# Patient Record
Sex: Male | Born: 1945 | Race: White | Hispanic: No | Marital: Married | Smoking: Former smoker
Health system: Southern US, Community
[De-identification: ages and names within clinical notes are randomized; demographics above are authoritative.]

## PROBLEM LIST (undated history)

## (undated) DIAGNOSIS — I4819 Other persistent atrial fibrillation: Secondary | ICD-10-CM

## (undated) DIAGNOSIS — G4733 Obstructive sleep apnea (adult) (pediatric): Secondary | ICD-10-CM

## (undated) DIAGNOSIS — M170 Bilateral primary osteoarthritis of knee: Secondary | ICD-10-CM

## (undated) DIAGNOSIS — E669 Obesity, unspecified: Secondary | ICD-10-CM

## (undated) DIAGNOSIS — I700 Atherosclerosis of aorta: Secondary | ICD-10-CM

## (undated) DIAGNOSIS — N400 Enlarged prostate without lower urinary tract symptoms: Secondary | ICD-10-CM

## (undated) DIAGNOSIS — I10 Essential (primary) hypertension: Secondary | ICD-10-CM

## (undated) HISTORY — DX: Benign prostatic hyperplasia without lower urinary tract symptoms: N40.0

## (undated) HISTORY — DX: Bilateral primary osteoarthritis of knee: M17.0

## (undated) HISTORY — DX: Obstructive sleep apnea (adult) (pediatric): G47.33

## (undated) HISTORY — DX: Essential (primary) hypertension: I10

## (undated) HISTORY — DX: Obesity, unspecified: E66.9

## (undated) HISTORY — DX: Atherosclerosis of aorta: I70.0

## (undated) HISTORY — PX: CERVICAL SPINE SURGERY: SHX589

## (undated) HISTORY — DX: Other persistent atrial fibrillation: I48.19

---

## 2013-10-06 DIAGNOSIS — M47817 Spondylosis without myelopathy or radiculopathy, lumbosacral region: Secondary | ICD-10-CM | POA: Insufficient documentation

## 2013-10-06 DIAGNOSIS — M4802 Spinal stenosis, cervical region: Secondary | ICD-10-CM | POA: Insufficient documentation

## 2013-10-06 DIAGNOSIS — N433 Hydrocele, unspecified: Secondary | ICD-10-CM | POA: Insufficient documentation

## 2013-10-06 DIAGNOSIS — Z86010 Personal history of colonic polyps: Secondary | ICD-10-CM | POA: Insufficient documentation

## 2013-10-06 DIAGNOSIS — M545 Low back pain, unspecified: Secondary | ICD-10-CM | POA: Insufficient documentation

## 2013-10-06 DIAGNOSIS — M5137 Other intervertebral disc degeneration, lumbosacral region: Secondary | ICD-10-CM | POA: Insufficient documentation

## 2013-10-06 DIAGNOSIS — J329 Chronic sinusitis, unspecified: Secondary | ICD-10-CM | POA: Insufficient documentation

## 2013-11-11 DIAGNOSIS — I4891 Unspecified atrial fibrillation: Secondary | ICD-10-CM | POA: Insufficient documentation

## 2013-11-28 DIAGNOSIS — R972 Elevated prostate specific antigen [PSA]: Secondary | ICD-10-CM | POA: Insufficient documentation

## 2013-11-28 DIAGNOSIS — K760 Fatty (change of) liver, not elsewhere classified: Secondary | ICD-10-CM | POA: Insufficient documentation

## 2013-11-28 DIAGNOSIS — M1990 Unspecified osteoarthritis, unspecified site: Secondary | ICD-10-CM | POA: Insufficient documentation

## 2013-11-28 DIAGNOSIS — R7303 Prediabetes: Secondary | ICD-10-CM | POA: Insufficient documentation

## 2013-12-07 ENCOUNTER — Inpatient Hospital Stay (HOSPITAL_COMMUNITY)
Admission: AD | Admit: 2013-12-07 | Discharge: 2013-12-10 | DRG: 310 | Disposition: A | Discharge status: Home / Self Care | Payer: Medicare Other | Source: Ambulatory Visit | Attending: Cardiology | Admitting: Cardiology

## 2013-12-07 ENCOUNTER — Encounter: Payer: Self-pay | Admitting: Cardiology

## 2013-12-07 ENCOUNTER — Other Ambulatory Visit: Payer: Self-pay | Admitting: Cardiology

## 2013-12-07 ENCOUNTER — Encounter (HOSPITAL_COMMUNITY): Payer: Self-pay | Admitting: General Practice

## 2013-12-07 ENCOUNTER — Encounter (INDEPENDENT_AMBULATORY_CARE_PROVIDER_SITE_OTHER): Payer: Self-pay

## 2013-12-07 ENCOUNTER — Ambulatory Visit (INDEPENDENT_AMBULATORY_CARE_PROVIDER_SITE_OTHER): Payer: Medicare Other | Admitting: Cardiology

## 2013-12-07 VITALS — BP 114/66 | HR 68 | Ht 68.0 in | Wt 212.0 lb

## 2013-12-07 DIAGNOSIS — R531 Weakness: Secondary | ICD-10-CM | POA: Diagnosis present

## 2013-12-07 DIAGNOSIS — I4891 Unspecified atrial fibrillation: Secondary | ICD-10-CM

## 2013-12-07 DIAGNOSIS — R5381 Other malaise: Secondary | ICD-10-CM

## 2013-12-07 DIAGNOSIS — Z7901 Long term (current) use of anticoagulants: Secondary | ICD-10-CM

## 2013-12-07 DIAGNOSIS — I4510 Unspecified right bundle-branch block: Secondary | ICD-10-CM | POA: Diagnosis present

## 2013-12-07 DIAGNOSIS — I10 Essential (primary) hypertension: Secondary | ICD-10-CM | POA: Diagnosis present

## 2013-12-07 DIAGNOSIS — Z79899 Other long term (current) drug therapy: Secondary | ICD-10-CM

## 2013-12-07 DIAGNOSIS — I440 Atrioventricular block, first degree: Secondary | ICD-10-CM | POA: Diagnosis present

## 2013-12-07 DIAGNOSIS — R5383 Other fatigue: Secondary | ICD-10-CM

## 2013-12-07 LAB — COMPREHENSIVE METABOLIC PANEL
ALT: 53 U/L (ref 0–53)
AST: 29 U/L (ref 0–37)
Albumin: 4 g/dL (ref 3.5–5.2)
Alkaline Phosphatase: 58 U/L (ref 39–117)
BUN: 21 mg/dL (ref 6–23)
CALCIUM: 9.5 mg/dL (ref 8.4–10.5)
CO2: 24 meq/L (ref 19–32)
CREATININE: 0.88 mg/dL (ref 0.50–1.35)
Chloride: 105 mEq/L (ref 96–112)
GFR, EST NON AFRICAN AMERICAN: 86 mL/min — AB (ref >90)
GLUCOSE: 122 mg/dL — AB (ref 70–99)
Potassium: 4.6 mEq/L (ref 3.7–5.3)
Sodium: 142 mEq/L (ref 137–147)
Total Bilirubin: 0.4 mg/dL (ref 0.3–1.2)
Total Protein: 7.1 g/dL (ref 6.0–8.3)

## 2013-12-07 LAB — CBC WITH DIFFERENTIAL/PLATELET
Basophils Absolute: 0 10*3/uL (ref 0.0–0.1)
Basophils Relative: 0 % (ref 0–1)
EOS ABS: 0.1 10*3/uL (ref 0.0–0.7)
EOS PCT: 1 % (ref 0–5)
HEMATOCRIT: 44.5 % (ref 39.0–52.0)
Hemoglobin: 16.2 g/dL (ref 13.0–17.0)
LYMPHS ABS: 1.4 10*3/uL (ref 0.7–4.0)
LYMPHS PCT: 18 % (ref 12–46)
MCH: 34.8 pg — ABNORMAL HIGH (ref 26.0–34.0)
MCHC: 36.4 g/dL — ABNORMAL HIGH (ref 30.0–36.0)
MCV: 95.5 fL (ref 78.0–100.0)
MONO ABS: 0.6 10*3/uL (ref 0.1–1.0)
Monocytes Relative: 8 % (ref 3–12)
Neutro Abs: 5.6 10*3/uL (ref 1.7–7.7)
Neutrophils Relative %: 73 % (ref 43–77)
Platelets: 205 10*3/uL (ref 150–400)
RBC: 4.66 MIL/uL (ref 4.22–5.81)
RDW: 11.8 % (ref 11.5–15.5)
WBC: 7.7 10*3/uL (ref 4.0–10.5)

## 2013-12-07 LAB — TSH: TSH: 1.03 u[IU]/mL (ref 0.350–4.500)

## 2013-12-07 LAB — PROTIME-INR
INR: 1.05 (ref 0.00–1.49)
Prothrombin Time: 13.5 seconds (ref 11.6–15.2)

## 2013-12-07 LAB — TROPONIN I
Troponin I: <0.3 ng/mL (ref <0.30)
Troponin I: <0.3 ng/mL (ref <0.30)

## 2013-12-07 LAB — APTT: aPTT: 32 seconds (ref 24–37)

## 2013-12-07 LAB — MAGNESIUM: MAGNESIUM: 2.1 mg/dL (ref 1.5–2.5)

## 2013-12-07 LAB — PRO B NATRIURETIC PEPTIDE: PRO B NATRI PEPTIDE: 536 pg/mL — AB (ref 0–125)

## 2013-12-07 MED ORDER — SODIUM CHLORIDE 0.9 % IV SOLN
INTRAVENOUS | Status: DC | PRN
  Administered 2013-12-07: 500 mL via INTRAVENOUS

## 2013-12-07 MED ORDER — RIVAROXABAN 20 MG PO TABS
20.0000 mg | ORAL_TABLET | Freq: Every day | ORAL | Status: DC
  Administered 2013-12-07 – 2013-12-09 (×3): 20 mg via ORAL
  Filled 2013-12-07 (×4): qty 1

## 2013-12-07 MED ORDER — SODIUM CHLORIDE 0.9 % IJ SOLN: 3.0000 mL | INTRAMUSCULAR | Status: DC | PRN

## 2013-12-07 MED ORDER — SODIUM CHLORIDE 0.9 % IJ SOLN
3.0000 mL | Freq: Two times a day (BID) | INTRAMUSCULAR | Status: DC
  Administered 2013-12-07 – 2013-12-09 (×4): 3 mL via INTRAVENOUS

## 2013-12-07 MED ORDER — ACETAMINOPHEN 325 MG PO TABS: 650.0000 mg | ORAL_TABLET | ORAL | Status: DC | PRN

## 2013-12-07 MED ORDER — SODIUM CHLORIDE 0.9 % IV SOLN
250.0000 mL | INTRAVENOUS | Status: DC | PRN
  Administered 2013-12-09: 13:00:00 via INTRAVENOUS

## 2013-12-07 MED ORDER — DILTIAZEM LOAD VIA INFUSION
10.0000 mg | Freq: Once | INTRAVENOUS | Status: AC
  Administered 2013-12-07: 10 mg via INTRAVENOUS
  Filled 2013-12-07: qty 10

## 2013-12-07 MED ORDER — DILTIAZEM HCL 100 MG IV SOLR
5.0000 mg/h | INTRAVENOUS | Status: DC
  Administered 2013-12-07: 5 mg/h via INTRAVENOUS
  Administered 2013-12-08: 15 mg/h via INTRAVENOUS
  Administered 2013-12-08: 10 mg/h via INTRAVENOUS
  Administered 2013-12-09 (×3): 15 mg/h via INTRAVENOUS
  Filled 2013-12-07 (×3): qty 100

## 2013-12-07 MED ORDER — ONDANSETRON HCL 4 MG/2ML IJ SOLN: 4.0000 mg | Freq: Four times a day (QID) | INTRAMUSCULAR | Status: DC | PRN

## 2013-12-07 NOTE — H&P (Signed)
[name], [place],   [address] Phone: [phone] Fax:  [phone]  Date:  12/07/2013   ID:  Corey Hood, DOB 1945/12/01, MRN [id number]  PCP:  [name]., MD  Cardiologist:  [name], MD     History of Present Illness: Corey Hood is a 68 y.o. male with no prior cardiac history, presented to Dr. Valora Piccolo with complaints of fatigue and decreased exercise tolerance.  He finally took his BP and the cuff read out with HR 150bpm.  He denied any chest pain or pressure but has noted increased DOE.  He denies any PND or orthopnea.  He has noticed some mild LE edema.   An EKG showed atrial fibrillation with RVR and was started on metoprolol.  He now presents for cardiac evaluation.  He stopped taking the metoprolol due to making him nauseated and he was started on Cardizem CD.  After starting the Cardizem he feels better although he still does not have much energy.   Wt Readings from Last 3 Encounters:  12/07/13 212 lb (96.163 kg)     Past Medical History  Diagnosis Date  . Arthritis of both knees   . Hypertension   . BPH (benign prostatic hyperplasia)   . Atrial fibrillation with RVR     Current Outpatient Prescriptions  Medication Sig Dispense Refill  . amLODipine-benazepril (LOTREL) 5-10 MG per capsule Take 1 capsule by mouth daily.      Marland Kitchen aspirin 325 MG tablet Take 325 mg by mouth daily.      . cyclobenzaprine (FLEXERIL) 10 MG tablet Take 10 mg by mouth 3 (three) times daily as needed for muscle spasms.      Marland Kitchen diltiazem (CARDIZEM) 120 MG tablet Take 120 mg by mouth 4 (four) times daily.      . meloxicam (MOBIC) 15 MG tablet Take 15 mg by mouth daily.       No current facility-administered medications for this visit.    Allergies:    Allergies  Allergen Reactions  . Penicillins     Social History:  The patient  reports that he has quit smoking. He does not have any smokeless tobacco history on file. He reports that he drinks about 16.8 ounces of  alcohol per week. He reports that he does not use illicit drugs.   Family History:  The patient's family history includes Cancer in his father and mother.   ROS:  Please see the history of present illness.      All other systems reviewed and negative.   PHYSICAL EXAM: VS:  BP 114/66  Pulse 68  Ht 5\' 8"  (1.727 m)  Wt 212 lb (96.163 kg)  BMI 32.24 kg/m2 Well nourished, well developed, in no acute distress HEENT: normal Neck: no JVD Cardiac:  normal S1, S2; irregularly irregular; no murmur Lungs:  clear to auscultation bilaterally, no wheezing, rhonchi or rales Abd: soft, nontender, no hepatomegaly Ext: no edema Skin: warm and dry Neuro:  CNs 2-12 intact, no focal abnormalities noted  EKG:  Atrial fibrillation with RVR with rightward axis     ASSESSMENT AND PLAN:  1. Atrial fibrillation with RVR of unknown duration but probably for several months - HR currently is 150bpm on Cardizem.  His BP is borderline low and I am leary about increasing his Cardizem PO any further.   - admit to stepdown tele bed - IV Cardizem gtt and titrate to get HR <100bpm as BP tolerates - check CMET/PT/INR/TSH/CBC -  2D echo to assess LVF once HR slowed down - PA and lat chest xray - BNP - start Xarelto 20mg  daily per pharmacy - further workup pending results of echo 2.  Weakness secondary to #1 3.  HTN controlled  Signed, [name], MD 12/07/2013 2:32 PM

## 2013-12-07 NOTE — Progress Notes (Signed)
[place], [place], [place]  [address] Phone: [phone] Fax:  [phone]  Date:  12/07/2013   ID:  Corey Hood, DOB 1945-10-15, MRN [id number]  PCP:  [name]., MD  Cardiologist:  [name], MD     History of Present Illness: Corey Hood is a 68 y.o. male with no prior cardiac history, presented to Dr. Valora Piccolo with complaints of fatigue and decreased exercise tolerance.  He finally took his BP and the cuff read out with HR 150bpm.  He denied any chest pain or pressure but has noted increased DOE.  He denies any PND or orthopnea.  He has noticed some mild LE edema.   An EKG showed atrial fibrillation with RVR and was started on metoprolol.  He now presents for cardiac evaluation.  He stopped taking the metoprolol due to making him nauseated and he was started on Cardizem CD.  After starting the Cardizem he feels better although he still does not have much energy.   Wt Readings from Last 3 Encounters:  12/07/13 212 lb (96.163 kg)     Past Medical History  Diagnosis Date  . Arthritis of both knees   . Hypertension   . BPH (benign prostatic hyperplasia)   . Atrial fibrillation with RVR     Current Outpatient Prescriptions  Medication Sig Dispense Refill  . amLODipine-benazepril (LOTREL) 5-10 MG per capsule Take 1 capsule by mouth daily.      Marland Kitchen aspirin 325 MG tablet Take 325 mg by mouth daily.      . cyclobenzaprine (FLEXERIL) 10 MG tablet Take 10 mg by mouth 3 (three) times daily as needed for muscle spasms.      Marland Kitchen diltiazem (CARDIZEM) 120 MG tablet Take 120 mg by mouth 4 (four) times daily.      . meloxicam (MOBIC) 15 MG tablet Take 15 mg by mouth daily.       No current facility-administered medications for this visit.    Allergies:    Allergies  Allergen Reactions  . Penicillins     Social History:  The patient  reports that he has quit smoking. He does not have any smokeless tobacco history on file. He reports that he drinks about 16.8 ounces of alcohol  per week. He reports that he does not use illicit drugs.   Family History:  The patient's family history includes Cancer in his father and mother.   ROS:  Please see the history of present illness.      All other systems reviewed and negative.   PHYSICAL EXAM: VS:  BP 114/66  Pulse 68  Ht 5\' 8"  (1.727 m)  Wt 212 lb (96.163 kg)  BMI 32.24 kg/m2 Well nourished, well developed, in no acute distress HEENT: normal Neck: no JVD Cardiac:  normal S1, S2; irregularly irregular; no murmur Lungs:  clear to auscultation bilaterally, no wheezing, rhonchi or rales Abd: soft, nontender, no hepatomegaly Ext: no edema Skin: warm and dry Neuro:  CNs 2-12 intact, no focal abnormalities noted  EKG:  Atrial fibrillation with RVR with rightward axis     ASSESSMENT AND PLAN:  1. Atrial fibrillation with RVR of unknown duration but probably for several months - HR currently is 150bpm on Cardizem.  His BP is borderline low and I am leary about increasing his Cardizem PO any further.   - admit to stepdown tele bed - IV Cardizem gtt and titrate to get HR <100bpm as BP tolerates - check CMET/PT/INR/TSH/CBC - 2D echo  to assess LVF once HR slowed down - PA and lat chest xray - BNP - start Xarelto 20mg  daily per pharmacy - further workup pending results of echo 2.  Weakness secondary to #1 3.  HTN controlled  Signed, [name], MD 12/07/2013 2:32 PM

## 2013-12-08 ENCOUNTER — Inpatient Hospital Stay (HOSPITAL_COMMUNITY): Payer: Medicare Other

## 2013-12-08 DIAGNOSIS — I059 Rheumatic mitral valve disease, unspecified: Secondary | ICD-10-CM

## 2013-12-08 DIAGNOSIS — I4891 Unspecified atrial fibrillation: Principal | ICD-10-CM

## 2013-12-08 DIAGNOSIS — I10 Essential (primary) hypertension: Secondary | ICD-10-CM

## 2013-12-08 LAB — BASIC METABOLIC PANEL
BUN: 19 mg/dL (ref 6–23)
CALCIUM: 8.9 mg/dL (ref 8.4–10.5)
CO2: 21 mEq/L (ref 19–32)
Chloride: 106 mEq/L (ref 96–112)
Creatinine, Ser: 0.81 mg/dL (ref 0.50–1.35)
GFR calc Af Amer: >90 mL/min (ref >90)
GFR, EST NON AFRICAN AMERICAN: 89 mL/min — AB (ref >90)
Glucose, Bld: 111 mg/dL — ABNORMAL HIGH (ref 70–99)
Potassium: 4.6 mEq/L (ref 3.7–5.3)
SODIUM: 139 meq/L (ref 137–147)

## 2013-12-08 LAB — CBC
HCT: 42.1 % (ref 39.0–52.0)
Hemoglobin: 15 g/dL (ref 13.0–17.0)
MCH: 34.2 pg — ABNORMAL HIGH (ref 26.0–34.0)
MCHC: 35.6 g/dL (ref 30.0–36.0)
MCV: 95.9 fL (ref 78.0–100.0)
PLATELETS: 187 10*3/uL (ref 150–400)
RBC: 4.39 MIL/uL (ref 4.22–5.81)
RDW: 11.8 % (ref 11.5–15.5)
WBC: 5.9 10*3/uL (ref 4.0–10.5)

## 2013-12-08 LAB — TROPONIN I

## 2013-12-08 LAB — MRSA PCR SCREENING: MRSA by PCR: NEGATIVE

## 2013-12-08 MED ORDER — ALPRAZOLAM 0.5 MG PO TABS: 0.5000 mg | ORAL_TABLET | Freq: Two times a day (BID) | ORAL | Status: DC | PRN

## 2013-12-08 MED ORDER — FLECAINIDE ACETATE 100 MG PO TABS
100.0000 mg | ORAL_TABLET | Freq: Two times a day (BID) | ORAL | Status: DC
  Administered 2013-12-08 – 2013-12-10 (×5): 100 mg via ORAL
  Filled 2013-12-08 (×6): qty 1

## 2013-12-08 NOTE — Progress Notes (Signed)
TELEMETRY: Reviewed telemetry pt in atrial fibrillation with rate 130 bpm.: Filed Vitals:   12/08/13 0805 12/08/13 0945 12/08/13 1156 12/08/13 1200  BP: 135/85 118/100 118/66 118/66  Pulse:      Temp: 98 F (36.7 C)  98.3 F (36.8 C)   TempSrc: Oral  Oral   Resp: 16     Height:      Weight:      SpO2: 98%  98%     Intake/Output Summary (Last 24 hours) at 12/08/13 1311 Last data filed at 12/08/13 1300  Gross per 24 hour  Intake 1718.75 ml  Output    950 ml  Net 768.75 ml   Filed Weights   12/07/13 1736  Weight: 206 lb 2.1 oz (93.5 kg)    Subjective Feels weak. No SOB or chest pain.  . flecainide  100 mg Oral Q12H  . rivaroxaban  20 mg Oral Q supper  . sodium chloride  3 mL Intravenous Q12H   On IV diltiazem 10 mg /hr  LABS: Basic Metabolic Panel:  Recent Labs  12/07/13 1800 12/08/13 0705  NA 142 139  K 4.6 4.6  CL 105 106  CO2 24 21  GLUCOSE 122* 111*  BUN 21 19  CREATININE 0.88 0.81  CALCIUM 9.5 8.9  MG 2.1  --    Liver Function Tests:  Recent Labs  12/07/13 1800  AST 29  ALT 53  ALKPHOS 58  BILITOT 0.4  PROT 7.1  ALBUMIN 4.0   No results found for this basename: LIPASE, AMYLASE,  in the last 72 hours CBC:  Recent Labs  12/07/13 1800 12/08/13 0705  WBC 7.7 5.9  NEUTROABS 5.6  --   HGB 16.2 15.0  HCT 44.5 42.1  MCV 95.5 95.9  PLT 205 187   Cardiac Enzymes:  Recent Labs  12/07/13 1740 12/07/13 2314 12/08/13 0705  TROPONINI <0.30 <0.30 <0.30   BNP:  Recent Labs  12/07/13 1740  PROBNP 536.0*   Thyroid Function Tests:  Recent Labs  12/07/13 1800  TSH 1.030     Radiology/Studies:  X-ray Chest Pa And Lateral  12/08/2013   CLINICAL DATA:  Shortness of breath.  Atrial fibrillation.  EXAM: CHEST  2 VIEW  COMPARISON:  None.  FINDINGS: Heart size and pulmonary vascularity are normal. Lungs are clear except for a calcified granuloma at the right lung base laterally. No acute osseous abnormality. No effusions.   IMPRESSION: No acute abnormalities.   Electronically Signed   By: Rozetta Nunnery M.D.   On: 12/08/2013 08:09    PHYSICAL EXAM General: Well developed, well nourished, in no acute distress. Head: Normocephalic, atraumatic, sclera non-icteric, oropharynx is clear Neck: Negative for carotid bruits. JVD not elevated. No adenopathy Lungs: Clear bilaterally to auscultation without wheezes, rales, or rhonchi. Breathing is unlabored. Heart: IRRR S1 S2 without murmurs, rubs, or gallops.  Abdomen: Soft, non-tender, non-distended with normoactive bowel sounds. No hepatomegaly. No rebound/guarding. No obvious abdominal masses. Msk:  Strength and tone appears normal for age. Extremities: No clubbing, cyanosis or edema.  Distal pedal pulses are 2+ and equal bilaterally. Neuro: Alert and oriented X 3. Moves all extremities spontaneously. Psych:  Responds to questions appropriately with a normal affect.  ASSESSMENT AND PLAN: 1. Atrial fibrillation with RVR. Rate high despite IV diltiazem. He did not tolerate metoprolol as outpatient. Will titrate diltiazem up. On Xarelto for anticoagulation. Echo reviewed and shows normal LV function. Mild MR/TR. biatrial enlargement. He is clearly symptomatic. Will initiate antiarrhythmic therapy  with Flecainide 100 mg bid. Plan TEE cardioversion in am.  2. HTN controlled.  Present on Admission:  . Atrial fibrillation with RVR  Signed, Peter M Martin[place], [place] 12/08/2013 1:11 PM

## 2013-12-08 NOTE — Care Management Note (Addendum)
    Page 1 of 1   12/08/2013     10:27:25 AM CARE MANAGEMENT NOTE 12/08/2013  Patient:  Corey Hood, Corey Hood   Account Number:  [id number]  Date Initiated:  12/08/2013  Documentation initiated by:  Elissa Hefty  Subjective/Objective Assessment:   adm w at fib w rvr     Action/Plan:   lives w wife, pcp dr Mamie Levers   Anticipated DC Date:     Anticipated DC Plan:        [place]  CM consult  Medication Assistance      Choice offered to / List presented to:             Status of service:   Medicare Important Message given?   (If response is "NO", the following Medicare IM given date fields will be blank) Date Medicare IM given:   Date Additional Medicare IM given:    Discharge Disposition:    Per UR Regulation:  Reviewed for med. necessity/level of care/duration of stay  If discussed at [place] of Stay Meetings, dates discussed:    Comments:  5/28 1026a debbie Adrean Heitz rn,bsn gave pt 30day free xarelto card. [facility]

## 2013-12-08 NOTE — Progress Notes (Signed)
  Echocardiogram 2D Echocardiogram has been performed.  Basilia Jumbo 12/08/2013, 10:51 AM

## 2013-12-09 ENCOUNTER — Encounter (HOSPITAL_COMMUNITY): Payer: Self-pay | Admitting: Gastroenterology

## 2013-12-09 ENCOUNTER — Encounter (HOSPITAL_COMMUNITY)
Admission: AD | Disposition: A | Discharge status: Home / Self Care | Payer: Self-pay | Source: Ambulatory Visit | Attending: Cardiology

## 2013-12-09 ENCOUNTER — Inpatient Hospital Stay (HOSPITAL_COMMUNITY): Payer: Medicare Other | Admitting: Anesthesiology

## 2013-12-09 ENCOUNTER — Encounter (HOSPITAL_COMMUNITY): Payer: Medicare Other | Admitting: Anesthesiology

## 2013-12-09 DIAGNOSIS — I4891 Unspecified atrial fibrillation: Secondary | ICD-10-CM

## 2013-12-09 HISTORY — PX: TEE WITHOUT CARDIOVERSION: SHX5443

## 2013-12-09 HISTORY — PX: CARDIOVERSION: SHX1299

## 2013-12-09 SURGERY — ECHOCARDIOGRAM, TRANSESOPHAGEAL
Anesthesia: Monitor Anesthesia Care

## 2013-12-09 MED ORDER — SODIUM CHLORIDE 0.9 % IV SOLN
INTRAVENOUS | Status: DC
  Administered 2013-12-09: 500 mL via INTRAVENOUS

## 2013-12-09 MED ORDER — DIPHENHYDRAMINE HCL 50 MG/ML IJ SOLN
INTRAMUSCULAR | Status: AC
  Filled 2013-12-09: qty 1

## 2013-12-09 MED ORDER — MIDAZOLAM HCL 10 MG/2ML IJ SOLN
INTRAMUSCULAR | Status: DC | PRN
  Administered 2013-12-09 (×3): 2 mg via INTRAVENOUS

## 2013-12-09 MED ORDER — MIDAZOLAM HCL 5 MG/ML IJ SOLN
INTRAMUSCULAR | Status: AC
  Filled 2013-12-09: qty 2

## 2013-12-09 MED ORDER — BUTAMBEN-TETRACAINE-BENZOCAINE 2-2-14 % EX AERO
INHALATION_SPRAY | CUTANEOUS | Status: DC | PRN
  Administered 2013-12-09: 2 via TOPICAL

## 2013-12-09 MED ORDER — FENTANYL CITRATE 0.05 MG/ML IJ SOLN
INTRAMUSCULAR | Status: DC | PRN
  Administered 2013-12-09 (×3): 25 ug via INTRAVENOUS

## 2013-12-09 MED ORDER — LIDOCAINE HCL (CARDIAC) 20 MG/ML IV SOLN
INTRAVENOUS | Status: DC | PRN
  Administered 2013-12-09: 30 mg via INTRAVENOUS

## 2013-12-09 MED ORDER — FENTANYL CITRATE 0.05 MG/ML IJ SOLN
INTRAMUSCULAR | Status: AC
  Filled 2013-12-09: qty 2

## 2013-12-09 MED ORDER — PROPOFOL 10 MG/ML IV BOLUS
INTRAVENOUS | Status: DC | PRN
  Administered 2013-12-09: 70 mg via INTRAVENOUS

## 2013-12-09 NOTE — Discharge Instructions (Addendum)
Information on my medicine - XARELTO (Rivaroxaban)  Why was Xarelto prescribed for you? Xarelto was prescribed for you to reduce the risk of a blood clot forming that can cause a stroke if you have a medical condition called atrial fibrillation (a type of irregular heartbeat).  What do you need to know about xarelto ? Take your Xarelto ONCE DAILY at the same time every day with your evening meal. If you have difficulty swallowing the tablet whole, you may crush it and mix in applesauce just prior to taking your dose.  Take Xarelto exactly as prescribed by your doctor and DO NOT stop taking Xarelto without talking to the doctor who prescribed the medication.  Stopping without other stroke prevention medication to take the place of Xarelto may increase your risk of developing a clot that causes a stroke.  Refill your prescription before you run out.  After discharge, you should have regular check-up appointments with your healthcare provider that is prescribing your Xarelto.  In the future your dose may need to be changed if your kidney function or weight changes by a significant amount.  What do you do if you miss a dose? If you are taking Xarelto ONCE DAILY and you miss a dose, take it as soon as you remember on the same day then continue your regularly scheduled once daily regimen the next day. Do not take two doses of Xarelto at the same time or on the same day.   Important Safety Information A possible side effect of Xarelto is bleeding. You should call your healthcare provider right away if you experience any of the following:   Bleeding from an injury or your nose that does not stop.   Unusual colored urine (red or dark brown) or unusual colored stools (red or black).   Unusual bruising for unknown reasons.   A serious fall or if you hit your head (even if there is no bleeding).  Some medicines may interact with Xarelto and might increase your risk of bleeding while on  Xarelto. To help avoid this, consult your healthcare provider or pharmacist prior to using any new prescription or non-prescription medications, including herbals, vitamins, non-steroidal anti-inflammatory drugs (NSAIDs) and supplements.  This website has more information on Xarelto: [email or web address]   Atrial Fibrillation Atrial fibrillation is a type of irregular heart rhythm (arrhythmia). During atrial fibrillation, the upper chambers of the heart (atria) quiver continuously in a chaotic pattern. This causes an irregular and often rapid heart rate.  Atrial fibrillation is the result of the heart becoming overloaded with disorganized signals that tell it to beat. These signals are normally released one at a time by a part of the right atrium called the sinoatrial node. They then travel from the atria to the lower chambers of the heart (ventricles), causing the atria and ventricles to contract and pump blood as they pass. In atrial fibrillation, parts of the atria outside of the sinoatrial node also release these signals. This results in two problems. First, the atria receive so many signals that they do not have time to fully contract. Second, the ventricles, which can only receive one signal at a time, beat irregularly and out of rhythm with the atria.  There are three types of atrial fibrillation:   Paroxysmal Paroxysmal atrial fibrillation starts suddenly and stops on its own within a week.   Persistent Persistent atrial fibrillation lasts for more than a week. It may stop on its own or with treatment.   Permanent Permanent atrial  fibrillation does not go away. Episodes of atrial fibrillation may lead to permanent atrial fibrillation.  Atrial fibrillation can prevent your heart from pumping blood normally. It increases your risk of stroke and can lead to heart failure.  CAUSES   Heart conditions, including a heart attack, heart failure, coronary artery disease, and heart valve conditions.    Inflammation of the sac that surrounds the heart (pericarditis).   Blockage of an artery in the lungs (pulmonary embolism).   Pneumonia or other infections.   Chronic lung disease.   Thyroid problems, especially if the thyroid is overactive (hyperthyroidism).   Caffeine, excessive alcohol use, and use of some illegal drugs.   Use of some medications, including certain decongestants and diet pills.   Heart surgery.   Birth defects.  Sometimes, no cause can be found. When this happens, the atrial fibrillation is called lone atrial fibrillation. The risk of complications from atrial fibrillation increases if you have lone atrial fibrillation and you are age 53 years or older. RISK FACTORS  Heart failure.  Coronary artery disease  Diabetes mellitus.   High blood pressure (hypertension).   Obesity.   Other arrhythmias.   Increased age. SYMPTOMS   A feeling that your heart is beating rapidly or irregularly.   A feeling of discomfort or pain in your chest.   Shortness of breath.   Sudden lightheadedness or weakness.   Getting tired easily when exercising.   Urinating more often than normal (mainly when atrial fibrillation first begins).  In paroxysmal atrial fibrillation, symptoms may start and suddenly stop. DIAGNOSIS  Your caregiver may be able to detect atrial fibrillation when taking your pulse. Usually, testing is needed to diagnosis atrial fibrillation. Tests may include:   Electrocardiography. During this test, the electrical impulses of your heart are recorded while you are lying down.   Echocardiography. During echocardiography, sound waves are used to evaluate how blood flows through your heart.   Stress test. There is more than one type of stress test. If a stress test is needed, ask your caregiver about which type is best for you.   Chest X-ray exam.   Blood tests.   Computed tomography (CT).  TREATMENT   Treating any  underlying conditions. For example, if you have an overactive thyroid, treating the condition may correct atrial fibrillation.   Medication. Medications may be given to control a rapid heart rate or to prevent blood clots, heart failure, or a stroke.   Procedure to correct the rhythm of the heart:  Electrical cardioversion. During electrical cardioversion, a controlled, low-energy shock is delivered to the heart through your skin. If you have chest pain, very low pressure blood pressure, or sudden heart failure, this procedure may need to be done as an emergency.  Catheter ablation. During this procedure, heart tissues that send the signals that cause atrial fibrillation are destroyed.  Maze or minimaze procedure. During this surgery, thin lines of heart tissue that carry the abnormal signals are destroyed. The maze procedure is an open-heart surgery. The minimaze procedure is a minimally invasive surgery. This means that small cuts are made to access the heart instead of a large opening.  Pulmonary venous isolation. During this surgery, tissue around the veins that carry blood from the lungs (pulmonary veins) is destroyed. This tissue is thought to carry the abnormal signals. HOME CARE INSTRUCTIONS   Take medications as directed by your caregiver.  Only take medications that your caregiver approves. Some medications can make atrial fibrillation worse or  recur.  If blood thinners were prescribed by your caregiver, take them exactly as directed. Too much can cause bleeding. Too little and you will not have the needed protection against stroke and other problems.  Perform blood tests at home if directed by your caregiver.  Perform blood tests exactly as directed.   Quit smoking if you smoke.   Do not drink alcohol.   Do not drink caffeinated beverages such as coffee, soda, and some teas. You may drink decaffeinated coffee, soda, or tea.   Maintain a healthy weight. Do not use diet  pills unless your caregiver approves. They may make heart problems worse.   Follow diet instructions as directed by your caregiver.   Exercise regularly as directed by your caregiver.   Keep all follow-up appointments. PREVENTION  The following substances can cause atrial fibrillation to recur:   Caffeinated beverages.   Alcohol.   Certain medications, especially those used for breathing problems.   Certain herbs and herbal medications, such as those containing ephedra or ginseng.  Illegal drugs such as cocaine and amphetamines. Sometimes medications are given to prevent atrial fibrillation from recurring. Proper treatment of any underlying condition is also important in helping prevent recurrence.  SEEK MEDICAL CARE IF:  You notice a change in the rate, rhythm, or strength of your heartbeat.   You suddenly begin urinating more frequently.   You tire more easily when exerting yourself or exercising.  SEEK IMMEDIATE MEDICAL CARE IF:   You develop chest pain, abdominal pain, sweating, or weakness.  You feel sick to your stomach (nauseous).  You develop shortness of breath.  You suddenly develop swollen feet and ankles.  You feel dizzy.  You face or limbs feel numb or weak.  There is a change in your vision or speech. MAKE SURE YOU:   Understand these instructions.  Will watch your condition.  Will get help right away if you are not doing well or get worse. Document Released: 06/30/2005 Document Revised: 10/25/2012 Document Reviewed: 08/10/2012 [facility] Patient Information 2014 [place].   Heart Healthy Diet  Call if your heart rate increases.  You will need stress test, please ask about scheduling on your next visit.   We stopped your lotrel and added benazepril alone for BP control.

## 2013-12-09 NOTE — Procedures (Signed)
Electrical Cardioversion Procedure Note Corey Hood [id number] 20-Dec-1945  Procedure: Electrical Cardioversion Indications:  Atrial Fibrillation.  TEE showed no thrombus, patient is on Xarelto.   Procedure Details Consent: Risks of procedure as well as the alternatives and risks of each were explained to the (patient/caregiver).  Consent for procedure obtained. Time Out: Verified patient identification, verified procedure, site/side was marked, verified correct patient position, special equipment/implants available, medications/allergies/relevent history reviewed, required imaging and test results available.  Performed  Patient placed on cardiac monitor, pulse oximetry, supplemental oxygen as necessary.  Sedation given: Propofol per anesthesiology.  Pacer pads placed anterior and posterior chest.  Cardioverted 1 time(s).  Cardioverted at [place].  Evaluation Findings: Post procedure EKG shows: NSR Complications: None Patient did tolerate procedure well.   Larey Dresser 12/09/2013, 1:22 PM

## 2013-12-09 NOTE — Anesthesia Postprocedure Evaluation (Signed)
  Anesthesia Post-op Note  Patient: Corey Hood  Procedure(s) Performed: Procedure(s): TRANSESOPHAGEAL ECHOCARDIOGRAM (TEE) (N/A) CARDIOVERSION (N/A)  Patient Location: Endoscopy Unit  Anesthesia Type:General  Level of Consciousness: awake, alert  and oriented  Airway and Oxygen Therapy: Patient Spontanous Breathing and Patient connected to nasal cannula oxygen  Post-op Pain: none  Post-op Assessment: Post-op Vital signs reviewed, Patient's Cardiovascular Status Stable, Respiratory Function Stable and Patent Airway  Post-op Vital Signs: Reviewed and stable  Last Vitals:  Filed Vitals:   12/09/13 1315  BP: 152/98  Pulse: 104  Temp:   Resp: 16    Complications: No apparent anesthesia complications

## 2013-12-09 NOTE — Progress Notes (Signed)
  Echocardiogram Echocardiogram Transesophageal has been performed.  Sherriann Szuch Orlean Patten 12/09/2013, 2:40 PM

## 2013-12-09 NOTE — Interval H&P Note (Signed)
History and Physical Interval Note:  12/09/2013 12:53 PM  Corey Hood  has presented today for surgery, with the diagnosis of AFIB  The various methods of treatment have been discussed with the patient and family. After consideration of risks, benefits and other options for treatment, the patient has consented to  Procedure(s): TRANSESOPHAGEAL ECHOCARDIOGRAM (TEE) (N/A) CARDIOVERSION (N/A) as a surgical intervention .  The patient's history has been reviewed, patient examined, no change in status, stable for surgery.  I have reviewed the patient's chart and labs.  Questions were answered to the patient's satisfaction.     Corey Hood [name]

## 2013-12-09 NOTE — CV Procedure (Signed)
Procedure: TEE  Indication: Atrial fibrillation  Sedation: Versed 6 mg IV, Fentanyl 75 mcg IV  Findings:  Please see echo section for full report.  Normal LV size and systolic function, EF 09-62%.  Trivial MR. Normal RV size and systolic function.  No PFO or ASD.  Mild LA enlargement.  No LAA thrombus.    May proceed to cardioversion.  Corey Hood 12/09/2013 1:20 PM

## 2013-12-09 NOTE — H&P (View-Only) (Signed)
TELEMETRY: Reviewed telemetry pt in atrial fibrillation with rate 110 bpm.: Filed Vitals:   12/08/13 1944 12/08/13 2347 12/09/13 0325 12/09/13 0807  BP: 147/66 130/75 124/96 127/86  Pulse:   90   Temp: 98.1 F (36.7 C) 98 F (36.7 C) 97.8 F (36.6 C) 98.6 F (37 C)  TempSrc: Oral Oral Oral Oral  Resp:    18  Height:      Weight:      SpO2:   98% 96%    Intake/Output Summary (Last 24 hours) at 12/09/13 0955 Last data filed at 12/09/13 0900  Gross per 24 hour  Intake    980 ml  Output    700 ml  Net    280 ml   Filed Weights   12/07/13 1736  Weight: 206 lb 2.1 oz (93.5 kg)    Subjective Feels weak. No SOB or chest pain.  . flecainide  100 mg Oral Q12H  . rivaroxaban  20 mg Oral Q supper  . sodium chloride  3 mL Intravenous Q12H   On IV diltiazem 15 mg /hr  LABS: Basic Metabolic Panel:  Recent Labs  12/07/13 1800 12/08/13 0705  NA 142 139  K 4.6 4.6  CL 105 106  CO2 24 21  GLUCOSE 122* 111*  BUN 21 19  CREATININE 0.88 0.81  CALCIUM 9.5 8.9  MG 2.1  --    Liver Function Tests:  Recent Labs  12/07/13 1800  AST 29  ALT 53  ALKPHOS 58  BILITOT 0.4  PROT 7.1  ALBUMIN 4.0   No results found for this basename: LIPASE, AMYLASE,  in the last 72 hours CBC:  Recent Labs  12/07/13 1800 12/08/13 0705  WBC 7.7 5.9  NEUTROABS 5.6  --   HGB 16.2 15.0  HCT 44.5 42.1  MCV 95.5 95.9  PLT 205 187   Cardiac Enzymes:  Recent Labs  12/07/13 1740 12/07/13 2314 12/08/13 0705  TROPONINI <0.30 <0.30 <0.30   BNP:  Recent Labs  12/07/13 1740  PROBNP 536.0*   Thyroid Function Tests:  Recent Labs  12/07/13 1800  TSH 1.030     Radiology/Studies:  X-ray Chest Pa And Lateral  12/08/2013   CLINICAL DATA:  Shortness of breath.  Atrial fibrillation.  EXAM: CHEST  2 VIEW  COMPARISON:  None.  FINDINGS: Heart size and pulmonary vascularity are normal. Lungs are clear except for a calcified granuloma at the right lung base laterally. No acute  osseous abnormality. No effusions.  IMPRESSION: No acute abnormalities.   Electronically Signed   By: Rozetta Nunnery M.D.   On: 12/08/2013 08:09   Study Conclusions/Echo  - Left ventricle: The cavity size was normal. Systolic function was normal. The estimated ejection fraction was in the range of 55% to 60%. Wall motion was normal; there were no regional wall motion abnormalities. - Mitral valve: There was mild regurgitation directed eccentrically. - Left atrium: The atrium was mildly to moderately dilated. - Right atrium: The atrium was mildly dilated. - Tricuspid valve: There was trivial regurgitation. - Pulmonary arteries: PA peak pressure: 32 mm Hg (S).   PHYSICAL EXAM General: Well developed, well nourished, in no acute distress. Head: Normocephalic, atraumatic, sclera non-icteric, oropharynx is clear Neck: Negative for carotid bruits. JVD not elevated. No adenopathy Lungs: Clear bilaterally to auscultation without wheezes, rales, or rhonchi. Breathing is unlabored. Heart: IRRR S1 S2 without murmurs, rubs, or gallops.  Abdomen: Soft, non-tender, non-distended with normoactive bowel sounds. No hepatomegaly. No rebound/guarding. No  obvious abdominal masses. Msk:  Strength and tone appears normal for age. Extremities: No clubbing, cyanosis or edema.  Distal pedal pulses are 2+ and equal bilaterally. Neuro: Alert and oriented X 3. Moves all extremities spontaneously. Psych:  Responds to questions appropriately with a normal affect.  ASSESSMENT AND PLAN: 1. Atrial fibrillation with RVR. Rate high despite IV diltiazem. He did not tolerate metoprolol as outpatient.  On Xarelto for anticoagulation. Echo reviewed and shows normal LV function. Mild MR/TR. biatrial enlargement. He is clearly symptomatic. On antiarrhythmic therapy with Flecainide 100 mg bid. Plan TEE cardioversion today.  2. HTN controlled.  Present on Admission:  . Atrial fibrillation with RVR  Signed, Peter M [place],  [place] 12/09/2013 9:55 AM

## 2013-12-09 NOTE — Transfer of Care (Signed)
Immediate Anesthesia Transfer of Care Note  Patient: Corey Hood  Procedure(s) Performed: Procedure(s): TRANSESOPHAGEAL ECHOCARDIOGRAM (TEE) (N/A) CARDIOVERSION (N/A)  Patient Location: Endoscopy Unit  Anesthesia Type:General  Level of Consciousness: awake, alert  and oriented  Airway & Oxygen Therapy: Patient Spontanous Breathing and Patient connected to nasal cannula oxygen  Post-op Assessment: Report given to PACU RN and Post -op Vital signs reviewed and stable  Post vital signs: Reviewed and stable  Complications: No apparent anesthesia complications

## 2013-12-09 NOTE — Anesthesia Postprocedure Evaluation (Signed)
  Anesthesia Post-op Note  Patient: Corey Hood  Procedure(s) Performed: Procedure(s): TRANSESOPHAGEAL ECHOCARDIOGRAM (TEE) (N/A) CARDIOVERSION (N/A)  Patient Location: PACU  Anesthesia Type:General  Level of Consciousness: awake, alert  and oriented  Airway and Oxygen Therapy: Patient Spontanous Breathing  Post-op Pain: none  Post-op Assessment: Post-op Vital signs reviewed, Patient's Cardiovascular Status Stable, Respiratory Function Stable, Patent Airway, No signs of Nausea or vomiting and Pain level controlled  Post-op Vital Signs: stable  Last Vitals:  Filed Vitals:   12/09/13 1500  BP: 108/68  Pulse: 68  Temp:   Resp:     Complications: No apparent anesthesia complications

## 2013-12-09 NOTE — Anesthesia Preprocedure Evaluation (Signed)
Anesthesia Evaluation  Patient identified by MRN, date of birth, ID band Patient awake    Reviewed: Allergy & Precautions, H&P , NPO status , Patient's Chart, lab work & pertinent test results  Airway       Dental   Pulmonary former smoker,          Cardiovascular hypertension, + dysrhythmias     Neuro/Psych    GI/Hepatic   Endo/Other    Renal/GU      Musculoskeletal   Abdominal   Peds  Hematology   Anesthesia Other Findings   Reproductive/Obstetrics                           Anesthesia Physical Anesthesia Plan  ASA: III  Anesthesia Plan: MAC and General   Post-op Pain Management:    Induction: Intravenous  Airway Management Planned: Mask  Additional Equipment:   Intra-op Plan:   Post-operative Plan:   Informed Consent: I have reviewed the patients History and Physical, chart, labs and discussed the procedure including the risks, benefits and alternatives for the proposed anesthesia with the patient or authorized representative who has indicated his/her understanding and acceptance.     Plan Discussed with:   Anesthesia Plan Comments:         Anesthesia Quick Evaluation

## 2013-12-09 NOTE — Progress Notes (Signed)
TELEMETRY: Reviewed telemetry pt in atrial fibrillation with rate 110 bpm.: Filed Vitals:   12/08/13 1944 12/08/13 2347 12/09/13 0325 12/09/13 0807  BP: 147/66 130/75 124/96 127/86  Pulse:   90   Temp: 98.1 F (36.7 C) 98 F (36.7 C) 97.8 F (36.6 C) 98.6 F (37 C)  TempSrc: Oral Oral Oral Oral  Resp:    18  Height:      Weight:      SpO2:   98% 96%    Intake/Output Summary (Last 24 hours) at 12/09/13 0955 Last data filed at 12/09/13 0900  Gross per 24 hour  Intake    980 ml  Output    700 ml  Net    280 ml   Filed Weights   12/07/13 1736  Weight: 206 lb 2.1 oz (93.5 kg)    Subjective Feels weak. No SOB or chest pain.  . flecainide  100 mg Oral Q12H  . rivaroxaban  20 mg Oral Q supper  . sodium chloride  3 mL Intravenous Q12H   On IV diltiazem 15 mg /hr  LABS: Basic Metabolic Panel:  Recent Labs  12/07/13 1800 12/08/13 0705  NA 142 139  K 4.6 4.6  CL 105 106  CO2 24 21  GLUCOSE 122* 111*  BUN 21 19  CREATININE 0.88 0.81  CALCIUM 9.5 8.9  MG 2.1  --    Liver Function Tests:  Recent Labs  12/07/13 1800  AST 29  ALT 53  ALKPHOS 58  BILITOT 0.4  PROT 7.1  ALBUMIN 4.0   No results found for this basename: LIPASE, AMYLASE,  in the last 72 hours CBC:  Recent Labs  12/07/13 1800 12/08/13 0705  WBC 7.7 5.9  NEUTROABS 5.6  --   HGB 16.2 15.0  HCT 44.5 42.1  MCV 95.5 95.9  PLT 205 187   Cardiac Enzymes:  Recent Labs  12/07/13 1740 12/07/13 2314 12/08/13 0705  TROPONINI <0.30 <0.30 <0.30   BNP:  Recent Labs  12/07/13 1740  PROBNP 536.0*   Thyroid Function Tests:  Recent Labs  12/07/13 1800  TSH 1.030     Radiology/Studies:  X-ray Chest Pa And Lateral  12/08/2013   CLINICAL DATA:  Shortness of breath.  Atrial fibrillation.  EXAM: CHEST  2 VIEW  COMPARISON:  None.  FINDINGS: Heart size and pulmonary vascularity are normal. Lungs are clear except for a calcified granuloma at the right lung base laterally. No acute  osseous abnormality. No effusions.  IMPRESSION: No acute abnormalities.   Electronically Signed   By: Rozetta Nunnery M.D.   On: 12/08/2013 08:09   Study Conclusions/Echo  - Left ventricle: The cavity size was normal. Systolic function was normal. The estimated ejection fraction was in the range of 55% to 60%. Wall motion was normal; there were no regional wall motion abnormalities. - Mitral valve: There was mild regurgitation directed eccentrically. - Left atrium: The atrium was mildly to moderately dilated. - Right atrium: The atrium was mildly dilated. - Tricuspid valve: There was trivial regurgitation. - Pulmonary arteries: PA peak pressure: 32 mm Hg (S).   PHYSICAL EXAM General: Well developed, well nourished, in no acute distress. Head: Normocephalic, atraumatic, sclera non-icteric, oropharynx is clear Neck: Negative for carotid bruits. JVD not elevated. No adenopathy Lungs: Clear bilaterally to auscultation without wheezes, rales, or rhonchi. Breathing is unlabored. Heart: IRRR S1 S2 without murmurs, rubs, or gallops.  Abdomen: Soft, non-tender, non-distended with normoactive bowel sounds. No hepatomegaly. No rebound/guarding. No  obvious abdominal masses. Msk:  Strength and tone appears normal for age. Extremities: No clubbing, cyanosis or edema.  Distal pedal pulses are 2+ and equal bilaterally. Neuro: Alert and oriented X 3. Moves all extremities spontaneously. Psych:  Responds to questions appropriately with a normal affect.  ASSESSMENT AND PLAN: 1. Atrial fibrillation with RVR. Rate high despite IV diltiazem. He did not tolerate metoprolol as outpatient.  On Xarelto for anticoagulation. Echo reviewed and shows normal LV function. Mild MR/TR. biatrial enlargement. He is clearly symptomatic. On antiarrhythmic therapy with Flecainide 100 mg bid. Plan TEE cardioversion today.  2. HTN controlled.  Present on Admission:  . Atrial fibrillation with RVR  Signed, Velinda Wrobel M Martin[place],  [place] 12/09/2013 9:55 AM

## 2013-12-10 ENCOUNTER — Encounter (HOSPITAL_COMMUNITY): Payer: Self-pay | Admitting: Cardiology

## 2013-12-10 ENCOUNTER — Other Ambulatory Visit: Payer: Self-pay | Admitting: Cardiology

## 2013-12-10 DIAGNOSIS — R9431 Abnormal electrocardiogram [ECG] [EKG]: Secondary | ICD-10-CM

## 2013-12-10 DIAGNOSIS — I10 Essential (primary) hypertension: Secondary | ICD-10-CM

## 2013-12-10 DIAGNOSIS — I480 Paroxysmal atrial fibrillation: Secondary | ICD-10-CM

## 2013-12-10 MED ORDER — FLECAINIDE ACETATE 100 MG PO TABS
100.0000 mg | ORAL_TABLET | Freq: Two times a day (BID) | ORAL | Status: DC
Start: 2013-12-10 — End: 2014-06-27

## 2013-12-10 MED ORDER — RIVAROXABAN 20 MG PO TABS: 20.0000 mg | ORAL_TABLET | Freq: Every day | ORAL | Status: DC

## 2013-12-10 MED ORDER — BENAZEPRIL HCL 10 MG PO TABS: 10.0000 mg | ORAL_TABLET | Freq: Every day | ORAL | Status: DC

## 2013-12-10 MED ORDER — DILTIAZEM HCL ER COATED BEADS 240 MG PO CP24: 240.0000 mg | ORAL_CAPSULE | Freq: Every day | ORAL | Status: DC

## 2013-12-10 MED ORDER — FLECAINIDE ACETATE 100 MG PO TABS: 100.0000 mg | ORAL_TABLET | Freq: Two times a day (BID) | ORAL | Status: DC

## 2013-12-10 MED ORDER — DILTIAZEM HCL ER COATED BEADS 120 MG PO TB24
240.0000 mg | ORAL_TABLET | Freq: Every day | ORAL | Status: DC
  Filled 2013-12-10: qty 2

## 2013-12-10 MED ORDER — DILTIAZEM HCL ER COATED BEADS 240 MG PO CP24
240.0000 mg | ORAL_CAPSULE | Freq: Every day | ORAL | Status: DC
  Administered 2013-12-10: 240 mg via ORAL
  Filled 2013-12-10: qty 1

## 2013-12-10 NOTE — Progress Notes (Signed)
Patient ID: Corey Hood, male   DOB: May 30, 1946, 68 y.o.   MRN: [id number]    SUBJECTIVE: Patient remains in NSR, no complaints today.    Scheduled Meds: . flecainide  100 mg Oral Q12H  . rivaroxaban  20 mg Oral Q supper  . sodium chloride  3 mL Intravenous Q12H   Continuous Infusions: . sodium chloride 500 mL (12/09/13 1132)  . diltiazem (CARDIZEM) infusion 15 mg/hr (12/09/13 2010)   PRN Meds:.sodium chloride, sodium chloride, acetaminophen, ALPRAZolam, ondansetron (ZOFRAN) IV, sodium chloride    Filed Vitals:   12/09/13 2325 12/10/13 0300 12/10/13 0400 12/10/13 0759  BP: 133/81 124/79 124/79 134/76  Pulse:  68  72  Temp:  97.3 F (36.3 C)  98 F (36.7 C)  TempSrc:  Oral  Oral  Resp:  16  18  Height:      Weight:      SpO2:  96%  98%    Intake/Output Summary (Last 24 hours) at 12/10/13 0928 Last data filed at 12/10/13 0745  Gross per 24 hour  Intake    765 ml  Output   1100 ml  Net   -335 ml    LABS: Basic Metabolic Panel:  Recent Labs  12/07/13 1800 12/08/13 0705  NA 142 139  K 4.6 4.6  CL 105 106  CO2 24 21  GLUCOSE 122* 111*  BUN 21 19  CREATININE 0.88 0.81  CALCIUM 9.5 8.9  MG 2.1  --    Liver Function Tests:  Recent Labs  12/07/13 1800  AST 29  ALT 53  ALKPHOS 58  BILITOT 0.4  PROT 7.1  ALBUMIN 4.0   No results found for this basename: LIPASE, AMYLASE,  in the last 72 hours CBC:  Recent Labs  12/07/13 1800 12/08/13 0705  WBC 7.7 5.9  NEUTROABS 5.6  --   HGB 16.2 15.0  HCT 44.5 42.1  MCV 95.5 95.9  PLT 205 187   Cardiac Enzymes:  Recent Labs  12/07/13 1740 12/07/13 2314 12/08/13 0705  TROPONINI <0.30 <0.30 <0.30   BNP: No components found with this basename: POCBNP,  D-Dimer: No results found for this basename: DDIMER,  in the last 72 hours Hemoglobin A1C: No results found for this basename: HGBA1C,  in the last 72 hours Fasting Lipid Panel: No results found for this basename: CHOL, HDL, LDLCALC, TRIG, CHOLHDL,  LDLDIRECT,  in the last 72 hours Thyroid Function Tests:  Recent Labs  12/07/13 1800  TSH 1.030   Anemia Panel: No results found for this basename: VITAMINB12, FOLATE, FERRITIN, TIBC, IRON, RETICCTPCT,  in the last 72 hours  RADIOLOGY: X-ray Chest Pa And Lateral  12/08/2013   CLINICAL DATA:  Shortness of breath.  Atrial fibrillation.  EXAM: CHEST  2 VIEW  COMPARISON:  None.  FINDINGS: Heart size and pulmonary vascularity are normal. Lungs are clear except for a calcified granuloma at the right lung base laterally. No acute osseous abnormality. No effusions.  IMPRESSION: No acute abnormalities.   Electronically Signed   By: Rozetta Nunnery M.D.   On: 12/08/2013 08:09    PHYSICAL EXAM General: NAD Neck: No JVD, no thyromegaly or thyroid nodule.  Lungs: Clear to auscultation bilaterally with normal respiratory effort. CV: Nondisplaced PMI.  Heart regular S1/S2, no S3/S4, no murmur.  No peripheral edema.   Abdomen: Soft, nontender, no hepatosplenomegaly, no distention.  Neurologic: Alert and oriented x 3.  Psych: Normal affect. Extremities: No clubbing or cyanosis.   TELEMETRY: Reviewed telemetry pt  in NSR  ASSESSMENT AND PLAN: 68 yo with history of HTN presented with symptomatic atrial fibrillation with RVR.  1. Atrial fibrillation: S/p TEE-guided DCCV to NSR yesterday.  EF normal.   - Transition to po diltiazem CD - Continue Xarelto and flecainide.  - ECG this morning on flecainide.   - Will need Cardiolite as outpatient given flecainide use.  - If breakthrough atrial fibrillation on flecainide, would be ablation candidate.  2. HTN: Will stop home Lotrel given diltiazem use.  Will continue benazepril 10 mg daily (part of Lotrel).  3. Disposition: Home this morning.  Needs followup in office in 10 days or so.  Needs ETT-Cardiolite scheduled as outpatient given flecainide use.  Meds: Diltiazem CD 240 mg daily, benazepril 10 mg daily, flecainide 100 mg bid, Xarelto 20 mg daily.    Larey Dresser 12/10/2013 9:32 AM

## 2013-12-10 NOTE — Discharge Summary (Signed)
Physician Discharge Summary       Patient ID: Corey Hood MRN: [id number] DOB/AGE: 01-26-1946 68 y.o.  Admit date: 12/07/2013 Discharge date: 12/10/2013  Discharge Diagnoses:  Principal Problem:   Atrial fibrillation with RVR, now SR at discharge s/p TEE, DCCV Active Problems:   Hypertension   Weakness secondary to A fib with RVR   Discharged Condition: good  Procedures: TEE 12/09/13 and DCCV by Dr. Selmer Hood Course: 68 y.o. male with no prior cardiac history, presented to Dr. Valora Hood with complaints of fatigue and decreased exercise tolerance. He finally took his BP and the cuff read out with HR 150bpm. He denied any chest pain or pressure but has noted increased DOE. He denies any PND or orthopnea. He has noticed some mild LE edema. An EKG showed atrial fibrillation with RVR and was started on metoprolol. He now presents for cardiac evaluation. He stopped taking the metoprolol due to making him nauseated and he was started on Cardizem CD. After starting the Cardizem he feels better although he still does not have much energy.   Pt was admitted and IV dilt started and Xarelto for anticoagulation.  Cardiac enzymes negative.  Echo revealed normal EF.  Pt then scheduled for TEE DCCV which he underwent on 12/09/13.  TEE:  Normal LV size and systolic function, EF 73-22%. Trivial MR. Normal RV size and systolic function. No PFO or ASD. Mild LA enlargement. No LAA thrombus. Pt then underwent DCCV with one shock at 200J, converting to [name].    Today he was seen and evaluated by Dr. Aundra Hood and found stable for discharge maintaining SR.  He does on EKG have icomplete RBBB, 1st degree AV block PR 214 ms.  Qtc 444 ms, QRS 108 ms.    Will need Cardiolite as outpatient given flecainide use.  - If breakthrough atrial fibrillation on flecainide, would be ablation candidate. Needs followup in office in 10 days or so.  Meds: Diltiazem CD 240 mg daily, benazepril 10 mg daily, flecainide 100 mg bid,  Xarelto 20 mg daily.  Pt given Xarelto 30 day free with prescription, also given printed prescriptions for the [place] in [place].   Consults: None  Significant Diagnostic Studies:  BMET    Component Value Date/Time   NA 139 12/08/2013 0705   K 4.6 12/08/2013 0705   CL 106 12/08/2013 0705   CO2 21 12/08/2013 0705   GLUCOSE 111* 12/08/2013 0705   BUN 19 12/08/2013 0705   CREATININE 0.81 12/08/2013 0705   CALCIUM 8.9 12/08/2013 0705   GFRNONAA 89* 12/08/2013 0705   GFRAA >90 12/08/2013 0705    CBC    Component Value Date/Time   WBC 5.9 12/08/2013 0705   RBC 4.39 12/08/2013 0705   HGB 15.0 12/08/2013 0705   HCT 42.1 12/08/2013 0705   PLT 187 12/08/2013 0705   MCV 95.9 12/08/2013 0705   MCH 34.2* 12/08/2013 0705   MCHC 35.6 12/08/2013 0705   RDW 11.8 12/08/2013 0705   LYMPHSABS 1.4 12/07/2013 1800   MONOABS 0.6 12/07/2013 1800   EOSABS 0.1 12/07/2013 1800   BASOSABS 0.0 12/07/2013 1800     Negative troponin Pro BNP 536 TSH 1.030  2 Decho: Left ventricle: The cavity size was normal. Systolic function was normal. The estimated ejection fraction was in the range of 55% to 60%. Wall motion was normal; there were no regional wall motion abnormalities. - Mitral valve: There was mild regurgitation directed eccentrically. - Left atrium: The atrium was mildly to  moderately dilated. - Right atrium: The atrium was mildly dilated. - Tricuspid valve: There was trivial regurgitation. - Pulmonary arteries: PA peak pressure: 32 mm Hg (S).  CHEST 2 VIEW COMPARISON: None. FINDINGS: Heart size and pulmonary vascularity are normal. Lungs are clear except for a calcified granuloma at the right lung base laterally. No acute osseous abnormality. No effusions. IMPRESSION: No acute abnormalities    Discharge Exam: Blood pressure 140/74, pulse 74, temperature 98 F (36.7 C), temperature source Oral, resp. rate 20, height 5\' 8"  (1.727 m), weight 206 lb 2.1 oz (93.5 kg), SpO2 99.00%.    Disposition:  home     Medication List    STOP taking these medications       amLODipine-benazepril 5-10 MG per capsule  Commonly known as:  LOTREL     aspirin 325 MG tablet      TAKE these medications       benazepril 10 MG tablet  Commonly known as:  LOTENSIN  Take 1 tablet (10 mg total) by mouth daily.     calcium-vitamin D 500-200 MG-UNIT per tablet  Commonly known as:  OSCAL WITH D  Take 1 tablet by mouth daily with breakfast.     cyclobenzaprine 10 MG tablet  Commonly known as:  FLEXERIL  Take 10 mg by mouth 3 (three) times daily as needed for muscle spasms.     diltiazem 240 MG 24 hr capsule  Commonly known as:  CARDIZEM CD  Take 1 capsule (240 mg total) by mouth daily.     flecainide 100 MG tablet  Commonly known as:  TAMBOCOR  Take 1 tablet (100 mg total) by mouth every 12 (twelve) hours.     KRILL OIL PO  Take 1 capsule by mouth daily.     meloxicam 15 MG tablet  Commonly known as:  MOBIC  Take 15 mg by mouth daily as needed (for pain).     multivitamin with minerals Tabs tablet  Take 1 tablet by mouth daily.     rivaroxaban 20 MG Tabs tablet  Commonly known as:  XARELTO  Take 1 tablet (20 mg total) by mouth daily with supper.     tamsulosin 0.4 MG Caps capsule  Commonly known as:  FLOMAX  Take 0.4 mg by mouth every other day.       Follow-up Information   Follow up with Corey Margarita, MD. (our office will call with date and time.  )    Specialty:  Cardiology   Contact information:   [address] [address] [phone]        Discharge Instructions: Heart Healthy Diet  Call if your heart rate increases.  You will need stress test, please ask about scheduling on your next visit.   We stopped your lotrel and added benazepril alone for BP control.  Signed: Cecilie Hood Nurse Practitioner-Certified [facility]: Antonieta Pert 12/10/2013, 12:24 PM  Time spent on discharge : >35 minutes.

## 2013-12-12 ENCOUNTER — Encounter (HOSPITAL_COMMUNITY): Payer: Self-pay | Admitting: Cardiology

## 2013-12-12 ENCOUNTER — Other Ambulatory Visit: Payer: Self-pay | Admitting: *Deleted

## 2013-12-22 ENCOUNTER — Ambulatory Visit (HOSPITAL_COMMUNITY): Payer: Medicare Other | Attending: Internal Medicine | Admitting: Radiology

## 2013-12-22 VITALS — BP 144/77 | Ht 68.0 in | Wt 204.0 lb

## 2013-12-22 DIAGNOSIS — R0602 Shortness of breath: Secondary | ICD-10-CM

## 2013-12-22 DIAGNOSIS — Z87891 Personal history of nicotine dependence: Secondary | ICD-10-CM | POA: Insufficient documentation

## 2013-12-22 DIAGNOSIS — R0989 Other specified symptoms and signs involving the circulatory and respiratory systems: Principal | ICD-10-CM | POA: Insufficient documentation

## 2013-12-22 DIAGNOSIS — R9431 Abnormal electrocardiogram [ECG] [EKG]: Secondary | ICD-10-CM

## 2013-12-22 DIAGNOSIS — R0609 Other forms of dyspnea: Secondary | ICD-10-CM | POA: Insufficient documentation

## 2013-12-22 DIAGNOSIS — I480 Paroxysmal atrial fibrillation: Secondary | ICD-10-CM

## 2013-12-22 DIAGNOSIS — I10 Essential (primary) hypertension: Secondary | ICD-10-CM | POA: Insufficient documentation

## 2013-12-22 DIAGNOSIS — I4891 Unspecified atrial fibrillation: Secondary | ICD-10-CM | POA: Insufficient documentation

## 2013-12-22 MED ORDER — TECHNETIUM TC 99M SESTAMIBI GENERIC - CARDIOLITE
10.8000 | Freq: Once | INTRAVENOUS | Status: AC | PRN
  Administered 2013-12-22: 11 via INTRAVENOUS

## 2013-12-22 MED ORDER — TECHNETIUM TC 99M SESTAMIBI GENERIC - CARDIOLITE
33.0000 | Freq: Once | INTRAVENOUS | Status: AC | PRN
  Administered 2013-12-22: 33 via INTRAVENOUS

## 2013-12-22 NOTE — Progress Notes (Signed)
[facility] [address] [place], [place] [address] [phone]    Cardiology Nuclear Med Study  Corey Hood is a 68 y.o. male     MRN : [id number]     DOB: 08/31/1945  Procedure Date: 12/22/2013  Nuclear Med Background Indication for Stress Test:  Evaluation for Ischemia, and Patient seen in hospital on 11-2013 for AFIB with RVR> Flecainide started>Assess for Ventricular Arrhythmias, enzymes negative History:  2015 Echo  EF 55-60%, Recent Cardioversion Cardiac Risk Factors: History of Smoking and Hypertension  Symptoms:  DOE   Nuclear Pre-Procedure Caffeine/Decaff Intake:  None> 12 hrs NPO After: 10:00pm   Lungs:  clear O2 Sat: 97% on room air. IV 0.9% NS with Angio Cath:  22g  IV Site: R Antecubital x 1, tolerated well IV Started by:  Irven Baltimore, RN  Chest Size (in):  44 Cup Size: n/a  Height: 5\' 8"  (1.727 m)  Weight:  204 lb (92.534 kg)  BMI:  Body mass index is 31.03 kg/(m^2). Tech Comments:  Patient took Flecainide, Diltiazem, and Lotensin this am. Irven Baltimore, RN.    Nuclear Med Study 1 or 2 day study: 1 day  Stress Test Type:  Stress  Reading MD: N/A  Order Authorizing Provider:  [name], MD  Resting Radionuclide: Technetium 43m Sestamibi  Resting Radionuclide Dose: 11.0 mCi   Stress Radionuclide:  Technetium 48m Sestamibi  Stress Radionuclide Dose: 33.0 mCi           Stress Protocol Rest HR: 61 Stress HR: 130  Rest BP: 144/77 Stress BP: 214/75  Exercise Time (min): 6:29 METS: 7.1   Predicted Max HR: 152 bpm % Max HR: 85.53 bpm Rate Pressure Product: 27820   Dose of Adenosine (mg):  n/a Dose of Lexiscan: n/a mg  Dose of Atropine (mg): n/a Dose of Dobutamine: n/a mcg/kg/min (at max HR)  Stress Test Technologist: Crissie Figures, RN  Nuclear Technologist:  Charlton Amor, CNMT     Rest Procedure:  Myocardial perfusion imaging was performed at rest 45 minutes following the intravenous administration of Technetium 22m  Sestamibi. Rest ECG: NSR - Normal EKG  Stress Procedure:  The patient exercised on the treadmill utilizing the Bruce Protocol for 6:29 minutes. The patient stopped due to fatigue and dyspnea and denied any chest pain.  Technetium 34m Sestamibi was injected at peak exercise and myocardial perfusion imaging was performed after a brief delay. Stress ECG: No significant change from baseline ECG  QPS Raw Data Images:  Normal; no motion artifact; normal heart/lung ratio. Stress Images:  Normal homogeneous uptake in all areas of the myocardium. Rest Images:  Normal homogeneous uptake in all areas of the myocardium. Subtraction (SDS):  No evidence of ischemia. Transient Ischemic Dilatation (Normal <1.22):  1.03 Lung/Heart Ratio (Normal <0.45):  0.27  Quantitative Gated Spect Images QGS EDV:  115 ml QGS ESV:  53 ml  Impression Exercise Capacity:  Good exercise capacity. BP Response:  Hypertensive blood pressure response. Clinical Symptoms:  There is dyspnea. ECG Impression:  No significant ST segment change suggestive of ischemia. Comparison with Prior Nuclear Study: No images to compare  Overall Impression:  Normal stress nuclear study.  LV Ejection Fraction: 54%.  LV Wall Motion:  NL LV Function; NL Wall Motion  Signed: [name], MD [facility]

## 2013-12-26 ENCOUNTER — Telehealth: Payer: Self-pay | Admitting: General Surgery

## 2013-12-26 ENCOUNTER — Telehealth: Payer: Self-pay | Admitting: Cardiology

## 2013-12-26 NOTE — Telephone Encounter (Signed)
He needs to see me or the PA in the next 1-2 weeks

## 2013-12-26 NOTE — Telephone Encounter (Signed)
Yes he needs to followup with me

## 2013-12-26 NOTE — Telephone Encounter (Signed)
How soon would you like him to f.u he was not given a f/u office visit when d/c from hospital

## 2013-12-26 NOTE — Telephone Encounter (Signed)
Pt would like to know if he needs to follow up with you. He has some questions about his medications and if they are long-term or not?

## 2013-12-26 NOTE — Telephone Encounter (Signed)
Patient is returning Amy's call. Please call back. Patient will be at this @ this number til 9am. You can leave a detailed message after 9 or you can talk to his wife. Please call back.

## 2013-12-26 NOTE — Telephone Encounter (Signed)
To Dr Radford Pax to make aware pt will be seen within the next two weeks as hospital f.u

## 2013-12-26 NOTE — Telephone Encounter (Signed)
Returned pt's call.

## 2013-12-26 NOTE — Telephone Encounter (Signed)
Message copied by Lily Kocher on Mon Dec 26, 2013  2:24 PM ------      Message from: Shirl Harris I      Created: Mon Dec 26, 2013  1:14 PM             Zevin Nevares            I have found an appt with Dr. Radford Pax on 01-03-14 and I have called the pt and left a message for him.            Thanks      [place]            ----- Message -----         From: Lily Kocher, CMA         Sent: 12/26/2013  12:09 PM           To: [facility]            Pt was recently seen in hospital and was supposed to be scheduled for a f/u. He did not get scheduled from anyone at the hospital. Please set pt up to see Dr Radford Pax or any of the PA/NP within the next 1-2 weeks thanks!       ------

## 2013-12-26 NOTE — Telephone Encounter (Signed)
Sent to scheduling to schedule pt to see Dr Radford Pax within the next week or two.

## 2014-01-03 ENCOUNTER — Encounter: Payer: Self-pay | Admitting: Cardiology

## 2014-01-03 ENCOUNTER — Ambulatory Visit (INDEPENDENT_AMBULATORY_CARE_PROVIDER_SITE_OTHER): Payer: Medicare Other | Admitting: Cardiology

## 2014-01-03 VITALS — BP 126/66 | HR 67 | Ht 68.0 in | Wt 212.0 lb

## 2014-01-03 DIAGNOSIS — I480 Paroxysmal atrial fibrillation: Secondary | ICD-10-CM

## 2014-01-03 DIAGNOSIS — I4891 Unspecified atrial fibrillation: Secondary | ICD-10-CM

## 2014-01-03 DIAGNOSIS — I4819 Other persistent atrial fibrillation: Secondary | ICD-10-CM

## 2014-01-03 DIAGNOSIS — I10 Essential (primary) hypertension: Secondary | ICD-10-CM

## 2014-01-03 NOTE — Progress Notes (Signed)
[place], [place], [place]  [address] Phone: [phone] Fax:  [phone]  Date:  01/03/2014   ID:  Corey Hood, DOB [birth year], MRN [id number]  PCP:  [name]., MD  Cardiologist:  [name], MD     History of Present Illness: Corey Hood is a 68 y.o. male with no prior cardiac history, presented to Dr. Valora Piccolo with complaints of fatigue and decreased exercise tolerance. He finally took his BP and the cuff read out with HR 150bpm. He denied any chest pain or pressure but has noted increased DOE. He denies any PND or orthopnea. He has noticed some mild LE edema. An EKG showed atrial fibrillation with RVR and was started on metoprolol. He stopped taking the metoprolol due to making him nauseated and he was started on Cardizem CD. His HR continued to be elevated and he was admitted to [facility] and started on IV Cardizem.  He was also started on Flecainide 100mg  BID and subsequently underwent TEE/DCCV to NSR.  He recently underwent ETT myoview which showed no ischemia and normal LVF and no EKG changes on flecainide.  He presents today for followup.  He is doing well.  He denies any chest pain, SOB, DOE, LE edema, dizziness, palpitations or syncope.   Wt Readings from Last 3 Encounters:  01/03/14 212 lb (96.163 kg)  12/22/13 204 lb (92.534 kg)  12/07/13 206 lb 2.1 oz (93.5 kg)     Past Medical History  Diagnosis Date  . Arthritis of both knees   . BPH (benign prostatic hyperplasia)   . Hypertension   . Dysrhythmia     atrial fibrilation  . History of cardioversion 12/09/13    to SR  . PAF (paroxysmal atrial fibrillation)     s/p TEE/DCCV    Current Outpatient Prescriptions  Medication Sig Dispense Refill  . benazepril (LOTENSIN) 10 MG tablet Take 1 tablet (10 mg total) by mouth daily.  30 tablet  6  . diltiazem (CARDIZEM CD) 240 MG 24 hr capsule Take 1 capsule (240 mg total) by mouth daily.  30 capsule  6  . flecainide (TAMBOCOR) 100 MG tablet Take 1 tablet (100 mg  total) by mouth every 12 (twelve) hours.  60 tablet  6  . rivaroxaban (XARELTO) 20 MG TABS tablet Take 1 tablet (20 mg total) by mouth daily with supper.  30 tablet  6  . calcium-vitamin D (OSCAL WITH D) 500-200 MG-UNIT per tablet Take 1 tablet by mouth daily with breakfast.      . cyclobenzaprine (FLEXERIL) 10 MG tablet Take 10 mg by mouth 3 (three) times daily as needed for muscle spasms.      Marland Kitchen KRILL OIL PO Take 1 capsule by mouth daily.      . meloxicam (MOBIC) 15 MG tablet Take 15 mg by mouth daily as needed (for pain).       . Multiple Vitamin (MULTIVITAMIN WITH MINERALS) TABS tablet Take 1 tablet by mouth daily.      . tamsulosin (FLOMAX) 0.4 MG CAPS capsule Take 0.4 mg by mouth every other day.       No current facility-administered medications for this visit.    Allergies:    Allergies  Allergen Reactions  . Codeine Other (See Comments)    "makes me loopy"  . Penicillins     Social History:  The patient  reports that he has quit smoking. He does not have any smokeless tobacco history on file. He reports that  he drinks about 16.8 ounces of alcohol per week. He reports that he does not use illicit drugs.   Family History:  The patient's family history includes Cancer in his father and mother.   ROS:  Please see the history of present illness.      All other systems reviewed and negative.   PHYSICAL EXAM: VS:  BP 126/66  Pulse 67  Ht 5\' 8"  (1.727 m)  Wt 212 lb (96.163 kg)  BMI 32.24 kg/m2  SpO2 95% Well nourished, well developed, in no acute distress HEENT: normal Neck: no JVD Cardiac:  normal S1, S2; RRR; no murmur Lungs:  clear to auscultation bilaterally, no wheezing, rhonchi or rales Abd: soft, nontender, no hepatomegaly Ext: no edema Skin: warm and dry Neuro:  CNs 2-12 intact, no focal abnormalities noted   EKG:  Sinus bradycardia at 58bpm  ASSESSMENT AND PLAN:  1.  Atrial fibrillation with RVR no in NSR on Flecainide/Xarelto and Cardizem s/p TEE/DCCV 2.  Weakness secondary to #1 - now resolved 3. HTN controlled  - continue diltiazem/benazepril  Followup with me in 6 months  Signed, [name], MD 01/03/2014 8:59 AM

## 2014-01-03 NOTE — Patient Instructions (Signed)
Your physician recommends that you continue on your current medications as directed. Please refer to the Current Medication list given to you today.  Your physician wants you to follow-up in: 6 months with Dr Turner You will receive a reminder letter in the mail two months in advance. If you don't receive a letter, please call our office to schedule the follow-up appointment.  

## 2014-04-21 ENCOUNTER — Encounter: Payer: Self-pay | Admitting: Cardiology

## 2014-06-27 ENCOUNTER — Other Ambulatory Visit: Payer: Self-pay | Admitting: *Deleted

## 2014-06-27 MED ORDER — FLECAINIDE ACETATE 100 MG PO TABS: 100.0000 mg | ORAL_TABLET | Freq: Two times a day (BID) | ORAL | Status: DC

## 2014-07-18 ENCOUNTER — Encounter: Payer: Self-pay | Admitting: Cardiology

## 2014-07-18 ENCOUNTER — Ambulatory Visit (INDEPENDENT_AMBULATORY_CARE_PROVIDER_SITE_OTHER): Payer: Commercial Managed Care - HMO | Admitting: Cardiology

## 2014-07-18 VITALS — BP 164/80 | HR 68 | Ht 68.0 in | Wt 219.0 lb

## 2014-07-18 DIAGNOSIS — I4891 Unspecified atrial fibrillation: Secondary | ICD-10-CM | POA: Diagnosis not present

## 2014-07-18 DIAGNOSIS — I10 Essential (primary) hypertension: Secondary | ICD-10-CM | POA: Diagnosis not present

## 2014-07-18 DIAGNOSIS — I480 Paroxysmal atrial fibrillation: Secondary | ICD-10-CM

## 2014-07-18 LAB — CBC WITH DIFFERENTIAL/PLATELET
Basophils Absolute: 0 10*3/uL (ref 0.0–0.1)
Basophils Relative: 0.3 % (ref 0.0–3.0)
EOS ABS: 0.1 10*3/uL (ref 0.0–0.7)
Eosinophils Relative: 2.1 % (ref 0.0–5.0)
HCT: 45.7 % (ref 39.0–52.0)
Hemoglobin: 15.9 g/dL (ref 13.0–17.0)
LYMPHS ABS: 1.3 10*3/uL (ref 0.7–4.0)
Lymphocytes Relative: 18.7 % (ref 12.0–46.0)
MCHC: 34.8 g/dL (ref 30.0–36.0)
MCV: 96.7 fl (ref 78.0–100.0)
Monocytes Absolute: 0.8 10*3/uL (ref 0.1–1.0)
Monocytes Relative: 10.7 % (ref 3.0–12.0)
NEUTROS PCT: 68.2 % (ref 43.0–77.0)
Neutro Abs: 4.8 10*3/uL (ref 1.4–7.7)
PLATELETS: 207 10*3/uL (ref 150.0–400.0)
RBC: 4.73 Mil/uL (ref 4.22–5.81)
RDW: 12.4 % (ref 11.5–15.5)
WBC: 7 10*3/uL (ref 4.0–10.5)

## 2014-07-18 LAB — BASIC METABOLIC PANEL
BUN: 15 mg/dL (ref 6–23)
CALCIUM: 9.4 mg/dL (ref 8.4–10.5)
CHLORIDE: 107 meq/L (ref 96–112)
CO2: 27 meq/L (ref 19–32)
CREATININE: 0.9 mg/dL (ref 0.4–1.5)
GFR: 85.71 mL/min (ref >60.00)
GLUCOSE: 99 mg/dL (ref 70–99)
Potassium: 4.4 mEq/L (ref 3.5–5.1)
Sodium: 139 mEq/L (ref 135–145)

## 2014-07-18 MED ORDER — BENAZEPRIL HCL 10 MG PO TABS: 10.0000 mg | ORAL_TABLET | Freq: Every day | ORAL | Status: DC

## 2014-07-18 MED ORDER — DILTIAZEM HCL ER COATED BEADS 240 MG PO CP24: 240.0000 mg | ORAL_CAPSULE | Freq: Every day | ORAL | Status: DC

## 2014-07-18 MED ORDER — FLECAINIDE ACETATE 100 MG PO TABS: 100.0000 mg | ORAL_TABLET | Freq: Two times a day (BID) | ORAL | Status: DC

## 2014-07-18 MED ORDER — RIVAROXABAN 20 MG PO TABS: 20.0000 mg | ORAL_TABLET | Freq: Every day | ORAL | Status: DC

## 2014-07-18 NOTE — Progress Notes (Signed)
[place], [address] [place], [place]  [address] Phone: [phone] Fax:  [phone]  Date:  07/18/2014   ID:  HERNAN TURNAGE, DOB 01/25/1946, MRN [id number]  PCP:  [name]., MD  Cardiologist:  [name], MD    History of Present Illness: Corey Hood is a 69 y.o. male with atrial fibrillation with RVR and was started on metoprolol. He stopped taking the metoprolol due to making him nauseated and he was started on Cardizem CD. His HR continued to be elevated and he was admitted to [facility] and started on IV Cardizem. He was also started on Flecainide 100mg  BID and subsequently underwent TEE/DCCV to NSR. He underwent ETT myoview which showed no ischemia and normal LVF and no EKG changes on flecainide. He presents today for followup. He is doing well. He denies any chest pain, SOB, DOE, LE edema, dizziness, palpitations or syncope.  He says that his PCP started him on HCTZ for HTN but he stopped it because he did not think he needed.      Wt Readings from Last 3 Encounters:  07/18/14 219 lb (99.338 kg)  01/03/14 212 lb (96.163 kg)  12/22/13 204 lb (92.534 kg)     Past Medical History  Diagnosis Date  . Arthritis of both knees   . BPH (benign prostatic hyperplasia)   . Hypertension   . PAF (paroxysmal atrial fibrillation)     s/p TEE/DCCV    Current Outpatient Prescriptions  Medication Sig Dispense Refill  . benazepril (LOTENSIN) 10 MG tablet Take 1 tablet (10 mg total) by mouth daily. 30 tablet 6  . calcium-vitamin D (OSCAL WITH D) 500-200 MG-UNIT per tablet Take 1 tablet by mouth daily with breakfast.    . diltiazem (CARDIZEM CD) 240 MG 24 hr capsule Take 1 capsule (240 mg total) by mouth daily. 30 capsule 6  . flecainide (TAMBOCOR) 100 MG tablet Take 1 tablet (100 mg total) by mouth every 12 (twelve) hours. 60 tablet 0  . hydrochlorothiazide (HYDRODIURIL) 25 MG tablet Take 25 mg by mouth daily. STOPPED WILL TALKED TO DOCTER TO SEE IF HE WILL CONTINUE    . KRILL OIL PO  Take 1 capsule by mouth daily.    . Multiple Vitamin (MULTIVITAMIN WITH MINERALS) TABS tablet Take 1 tablet by mouth daily.    . rivaroxaban (XARELTO) 20 MG TABS tablet Take 1 tablet (20 mg total) by mouth daily with supper. 30 tablet 6   No current facility-administered medications for this visit.    Allergies:    Allergies  Allergen Reactions  . Codeine Other (See Comments)    "makes me loopy"  . Penicillins     Social History:  The patient  reports that he has quit smoking. He does not have any smokeless tobacco history on file. He reports that he drinks about 16.8 oz of alcohol per week. He reports that he does not use illicit drugs.   Family History:  The patient's family history includes Cancer in his father and mother.   ROS:  Please see the history of present illness.      All other systems reviewed and negative.   PHYSICAL EXAM: VS:  BP 164/80 mmHg  Pulse 68  Ht 5\' 8"  (1.727 m)  Wt 219 lb (99.338 kg)  BMI 33.31 kg/m2 Well nourished, well developed, in no acute distress HEENT: normal Neck: no JVD Cardiac:  normal S1, S2; RRR; no murmur Lungs:  clear to auscultation bilaterally, no wheezing, rhonchi or  rales Abd: soft, nontender, no hepatomegaly Ext: no edema Skin: warm and dry Neuro:  CNs 2-12 intact, no focal abnormalities noted  EKG:     NSR with IRBBBB  ASSESSMENT AND PLAN:  1. Atrial fibrillation with RVR now in NSR on Flecainide/Xarelto and Cardizem s/p TEE/DCCV.  I will check a NOAC panel today 2.  HTN poorly controlled  - continue diltiazem/benazepril - I have encouraged him to go back on the HCTZ as directed by his PCP - I also encouraged him to stop added table salt in his diet  Followup with me in 6 months  Signed, [name], MD [facility] 07/18/2014 10:27 AM

## 2014-07-18 NOTE — Patient Instructions (Addendum)
Your physician recommends that you have lab work TODAY (CBC, BMET)  Your physician recommends that you continue on your current medications as directed. Please refer to the Current Medication list given to you today.  Your physician wants you to follow-up in: 6 months with Dr. Radford Pax. You will receive a reminder letter in the mail two months in advance. If you don't receive a letter, please call our office to schedule the follow-up appointment.

## 2014-10-16 ENCOUNTER — Telehealth: Payer: Self-pay | Admitting: Cardiology

## 2014-10-16 NOTE — Telephone Encounter (Signed)
Ok to be off xarelto for procedure

## 2014-10-16 NOTE — Telephone Encounter (Signed)
Will forward to Dr Turner for review and orders 

## 2014-10-16 NOTE — Telephone Encounter (Signed)
Ok to be off Xarelto for a week

## 2014-10-16 NOTE — Telephone Encounter (Signed)
Pt returned call. Pt is to have a Biopsy on his prostate.   Pt states that Dr. Nevada Crane at [facility] in [facility] said he would have to be off of the Xarelto for one week. Pt states he forgot to state how long he would have to be off of the medicine. Pt would like to know if this will still be ok. Informed pt I would send this new information over to Dr. Radford Pax to review and see if ok to hold Xarelto for one week?

## 2014-10-16 NOTE — Telephone Encounter (Signed)
Pt advised Dr Radford Pax has said Ok to hold Xarelto for 1 week prior to prostate biopsy.

## 2014-10-16 NOTE — Telephone Encounter (Signed)
Left message to call back. Need to find out what kind of procedure pt is having.

## 2014-10-16 NOTE — Telephone Encounter (Signed)
PT HAVING PROCEDURE AND HE HAS TO OFF XARELTO--WANTS TO KNOW IF THERE IS ANYTHING HE NEEDS TO LOOK OUT FOR WHILE OFF OR ANYTHING HE NEEDS TO DO-PLS ADVISE  [phone] OK TO LEAVE MESSAGE OR SPEAK TO WIFE

## 2014-10-31 ENCOUNTER — Encounter: Payer: Self-pay | Admitting: Cardiology

## 2015-02-01 ENCOUNTER — Ambulatory Visit: Payer: Medicare HMO | Admitting: Cardiology

## 2015-03-06 ENCOUNTER — Other Ambulatory Visit: Payer: Self-pay | Admitting: *Deleted

## 2015-03-06 MED ORDER — FLECAINIDE ACETATE 100 MG PO TABS
100.0000 mg | ORAL_TABLET | Freq: Two times a day (BID) | ORAL | Status: DC
Start: 2015-03-06 — End: 2015-04-04

## 2015-04-04 ENCOUNTER — Encounter: Payer: Self-pay | Admitting: Cardiology

## 2015-04-04 ENCOUNTER — Ambulatory Visit (INDEPENDENT_AMBULATORY_CARE_PROVIDER_SITE_OTHER): Payer: Medicare HMO | Admitting: Cardiology

## 2015-04-04 VITALS — BP 130/68 | HR 68 | Ht 68.0 in | Wt 216.8 lb

## 2015-04-04 DIAGNOSIS — I10 Essential (primary) hypertension: Secondary | ICD-10-CM | POA: Diagnosis not present

## 2015-04-04 DIAGNOSIS — I480 Paroxysmal atrial fibrillation: Secondary | ICD-10-CM | POA: Diagnosis not present

## 2015-04-04 MED ORDER — FLECAINIDE ACETATE 100 MG PO TABS: 100.0000 mg | ORAL_TABLET | Freq: Two times a day (BID) | ORAL | Status: DC

## 2015-04-04 MED ORDER — BENAZEPRIL HCL 10 MG PO TABS: 10.0000 mg | ORAL_TABLET | Freq: Every day | ORAL | Status: DC

## 2015-04-04 MED ORDER — HYDROCHLOROTHIAZIDE 25 MG PO TABS: 25.0000 mg | ORAL_TABLET | Freq: Every day | ORAL | Status: DC

## 2015-04-04 NOTE — Addendum Note (Signed)
Addended by: Harland German A on: 04/04/2015 04:14 PM   Modules accepted: Orders

## 2015-04-04 NOTE — H&P (Signed)
Cardiology Office Note   Date:  04/04/2015   ID:  BRYAN GOIN, DOB 1945/08/02, MRN [id number]  PCP:  [name]., MD    Chief Complaint  Patient presents with  . PAF      History of Present Illness: Corey Hood is a 69 y.o. male with paroxysmal atrial fibrillation s/p TEE/DCCV now on Flecainide, Cardizem and  Xarelto. He presents today for followup. He is doing well. He denies any chest pain, LE edema, dizziness, palpitations or syncope. He has noticed some DOE this summer that he thinks is due to his weight gain and large abdomen.    Past Medical History  Diagnosis Date  . Arthritis of both knees   . BPH (benign prostatic hyperplasia)   . Hypertension   . PAF (paroxysmal atrial fibrillation)     s/p TEE/DCCV    Past Surgical History  Procedure Laterality Date  . Cervical spine surgery    . Tee without cardioversion N/A 12/09/2013    Procedure: TRANSESOPHAGEAL ECHOCARDIOGRAM (TEE);  Surgeon: Larey Dresser, MD;  Location: [place];  Service: Cardiovascular;  Laterality: N/A;  . Cardioversion N/A 12/09/2013    Procedure: CARDIOVERSION;  Surgeon: Larey Dresser, MD;  Location: [place];  Service: Cardiovascular;  Laterality: N/A;     Current Outpatient Prescriptions  Medication Sig Dispense Refill  . benazepril (LOTENSIN) 10 MG tablet Take 1 tablet (10 mg total) by mouth daily. 30 tablet 6  . calcium-vitamin D (OSCAL WITH D) 500-200 MG-UNIT per tablet Take 1 tablet by mouth daily with breakfast.    . Coenzyme Q10 (COQ10) 100 MG CAPS Take 1 capsule by mouth daily.    Marland Kitchen diltiazem (CARDIZEM CD) 240 MG 24 hr capsule Take 1 capsule (240 mg total) by mouth daily. 30 capsule 6  . finasteride (PROSCAR) 5 MG tablet Take 5 mg by mouth daily.  0  . flecainide (TAMBOCOR) 100 MG tablet Take 1 tablet (100 mg total) by mouth every 12 (twelve) hours. 60 tablet 1  . hydrochlorothiazide (HYDRODIURIL) 25 MG tablet Take 25 mg by mouth daily. STOPPED WILL  TALKED TO DOCTER TO SEE IF HE WILL CONTINUE    . KRILL OIL PO Take 1 capsule by mouth daily.    . Magnesium 250 MG TABS Take 1 tablet by mouth daily.    . Multiple Vitamin (MULTIVITAMIN WITH MINERALS) TABS tablet Take 1 tablet by mouth daily.    Marland Kitchen OVER THE COUNTER MEDICATION Take 1 capsule by mouth daily. Pt takes Livaplex by mouth daily    . Potassium 99 MG TABS Take 99 mg by mouth daily.    . rivaroxaban (XARELTO) 20 MG TABS tablet Take 1 tablet (20 mg total) by mouth daily with supper. 30 tablet 6  . Tragacanth (ASTRAGALUS ROOT) POWD Take 1 capsule by mouth daily.     No current facility-administered medications for this visit.    Allergies:   Codeine and Penicillins    Social History:  The patient  reports that he has quit smoking. He does not have any smokeless tobacco history on file. He reports that he drinks about 16.8 oz of alcohol per week. He reports that he does not use illicit drugs.   Family History:  The patient's family history includes Cancer in his father and mother.    ROS:  Please see the history of present illness.   Otherwise, review of systems  are positive for none.   All other systems are reviewed and negative.    PHYSICAL EXAM: VS:  BP 130/68 mmHg  Pulse 68  Ht 5\' 8"  (1.727 m)  Wt 216 lb 12.8 oz (98.34 kg)  BMI 32.97 kg/m2  SpO2 93% , BMI Body mass index is 32.97 kg/(m^2). GEN: Well nourished, well developed, in no acute distress HEENT: normal Neck: no JVD, carotid bruits, or masses Cardiac: RRR; no murmurs, rubs, or gallops,no edema  Respiratory:  clear to auscultation bilaterally, normal work of breathing GI: soft, nontender, nondistended, + BS MS: no deformity or atrophy Skin: warm and dry, no rash Neuro:  Strength and sensation are intact Psych: euthymic mood, full affect   EKG:  EKG is not ordered today.   Recent Labs: 07/18/2014: BUN 15; Creatinine, Ser 0.9; Hemoglobin 15.9; Platelets 207.0; Potassium 4.4; Sodium 139    Lipid Panel No  results found for: CHOL, TRIG, HDL, CHOLHDL, VLDL, LDLCALC, LDLDIRECT    Wt Readings from Last 3 Encounters:  04/04/15 216 lb 12.8 oz (98.34 kg)  07/18/14 219 lb (99.338 kg)  01/03/14 212 lb (96.163 kg)      ASSESSMENT AND PLAN:  1.PAF maintaining  NSR on Flecainide/Xarelto and Cardizem s/p TEE/DCCV. I will check a NOAC panel today 2. HTN controlled  - continue diltiazem/benazepril/HCTZ 3.  Obesity - I have strongly encouraged him to cut back on his ETOH intake and follow a low carb diet   Current medicines are reviewed at length with the patient today.  The patient does not have concerns regarding medicines.  The following changes have been made:  no change  Labs/ tests ordered today: See above Assessment and Plan No orders of the defined types were placed in this encounter.     Disposition:   FU with me in 6 months  Signed, Sueanne Margarita, MD  04/04/2015 3:51 PM    [facility] [place], [place], [place]  [address] Phone: [phone]; Fax: [phone]

## 2015-04-04 NOTE — Patient Instructions (Signed)
Medication Instructions:  Your physician recommends that you continue on your current medications as directed. Please refer to the Current Medication list given to you today.   Labwork: TODAY: BMET, CBC  Testing/Procedures: None  Follow-Up: Your physician wants you to follow-up in: 6 months with Dr. Turner. You will receive a reminder letter in the mail two months in advance. If you don't receive a letter, please call our office to schedule the follow-up appointment.   Any Other Special Instructions Will Be Listed Below (If Applicable).  

## 2015-04-05 LAB — CBC WITH DIFFERENTIAL/PLATELET
Basophils Absolute: 0 10*3/uL (ref 0.0–0.1)
Basophils Relative: 0.3 % (ref 0.0–3.0)
EOS PCT: 1 % (ref 0.0–5.0)
Eosinophils Absolute: 0.1 10*3/uL (ref 0.0–0.7)
HCT: 44.9 % (ref 39.0–52.0)
HEMOGLOBIN: 15.4 g/dL (ref 13.0–17.0)
Lymphocytes Relative: 22.1 % (ref 12.0–46.0)
Lymphs Abs: 1.7 10*3/uL (ref 0.7–4.0)
MCHC: 34.2 g/dL (ref 30.0–36.0)
MCV: 98.4 fl (ref 78.0–100.0)
MONOS PCT: 10.8 % (ref 3.0–12.0)
Monocytes Absolute: 0.8 10*3/uL (ref 0.1–1.0)
Neutro Abs: 4.9 10*3/uL (ref 1.4–7.7)
Neutrophils Relative %: 65.8 % (ref 43.0–77.0)
Platelets: 202 10*3/uL (ref 150.0–400.0)
RBC: 4.56 Mil/uL (ref 4.22–5.81)
RDW: 12.5 % (ref 11.5–15.5)
WBC: 7.5 10*3/uL (ref 4.0–10.5)

## 2015-04-05 LAB — BASIC METABOLIC PANEL
BUN: 18 mg/dL (ref 6–23)
CHLORIDE: 104 meq/L (ref 96–112)
CO2: 27 mEq/L (ref 19–32)
Calcium: 9.3 mg/dL (ref 8.4–10.5)
Creatinine, Ser: 0.89 mg/dL (ref 0.40–1.50)
GFR: 89.98 mL/min (ref >60.00)
GLUCOSE: 104 mg/dL — AB (ref 70–99)
POTASSIUM: 4.4 meq/L (ref 3.5–5.1)
SODIUM: 139 meq/L (ref 135–145)

## 2015-05-10 ENCOUNTER — Emergency Department (HOSPITAL_COMMUNITY)
Admission: EM | Admit: 2015-05-10 | Discharge: 2015-05-10 | Disposition: A | Discharge status: Home / Self Care | Payer: Medicare HMO | Attending: Emergency Medicine | Admitting: Emergency Medicine

## 2015-05-10 ENCOUNTER — Encounter (HOSPITAL_COMMUNITY): Payer: Self-pay | Admitting: Emergency Medicine

## 2015-05-10 DIAGNOSIS — Z79899 Other long term (current) drug therapy: Secondary | ICD-10-CM | POA: Diagnosis not present

## 2015-05-10 DIAGNOSIS — I10 Essential (primary) hypertension: Secondary | ICD-10-CM | POA: Diagnosis not present

## 2015-05-10 DIAGNOSIS — R002 Palpitations: Secondary | ICD-10-CM | POA: Diagnosis present

## 2015-05-10 DIAGNOSIS — Z87891 Personal history of nicotine dependence: Secondary | ICD-10-CM | POA: Insufficient documentation

## 2015-05-10 DIAGNOSIS — I480 Paroxysmal atrial fibrillation: Secondary | ICD-10-CM | POA: Insufficient documentation

## 2015-05-10 DIAGNOSIS — Z880 Allergy status to penicillin: Secondary | ICD-10-CM | POA: Diagnosis not present

## 2015-05-10 DIAGNOSIS — M1990 Unspecified osteoarthritis, unspecified site: Secondary | ICD-10-CM | POA: Insufficient documentation

## 2015-05-10 LAB — CBC WITH DIFFERENTIAL/PLATELET
BASOS ABS: 0 10*3/uL (ref 0.0–0.1)
BASOS PCT: 0 %
EOS ABS: 0.1 10*3/uL (ref 0.0–0.7)
Eosinophils Relative: 1 %
HCT: 48.4 % (ref 39.0–52.0)
HEMOGLOBIN: 17.3 g/dL — AB (ref 13.0–17.0)
Lymphocytes Relative: 22 %
Lymphs Abs: 1.8 10*3/uL (ref 0.7–4.0)
MCH: 34 pg (ref 26.0–34.0)
MCHC: 35.7 g/dL (ref 30.0–36.0)
MCV: 95.1 fL (ref 78.0–100.0)
MONO ABS: 1.1 10*3/uL — AB (ref 0.1–1.0)
MONOS PCT: 13 %
NEUTROS PCT: 64 %
Neutro Abs: 5.3 10*3/uL (ref 1.7–7.7)
Platelets: 204 10*3/uL (ref 150–400)
RBC: 5.09 MIL/uL (ref 4.22–5.81)
RDW: 11.8 % (ref 11.5–15.5)
WBC: 8.3 10*3/uL (ref 4.0–10.5)

## 2015-05-10 LAB — URINALYSIS, ROUTINE W REFLEX MICROSCOPIC
BILIRUBIN URINE: NEGATIVE
GLUCOSE, UA: NEGATIVE mg/dL
HGB URINE DIPSTICK: NEGATIVE
Ketones, ur: NEGATIVE mg/dL
Nitrite: NEGATIVE
PH: 5 (ref 5.0–8.0)
Protein, ur: NEGATIVE mg/dL
SPECIFIC GRAVITY, URINE: 1.019 (ref 1.005–1.030)
Urobilinogen, UA: 0.2 mg/dL (ref 0.0–1.0)

## 2015-05-10 LAB — URINE MICROSCOPIC-ADD ON

## 2015-05-10 LAB — RAPID URINE DRUG SCREEN, HOSP PERFORMED
Amphetamines: NOT DETECTED
BARBITURATES: NOT DETECTED
Benzodiazepines: NOT DETECTED
Cocaine: NOT DETECTED
Opiates: NOT DETECTED
TETRAHYDROCANNABINOL: NOT DETECTED

## 2015-05-10 LAB — BASIC METABOLIC PANEL
Anion gap: 12 (ref 5–15)
BUN: 15 mg/dL (ref 6–20)
CALCIUM: 9.8 mg/dL (ref 8.9–10.3)
CO2: 26 mmol/L (ref 22–32)
CREATININE: 1.04 mg/dL (ref 0.61–1.24)
Chloride: 105 mmol/L (ref 101–111)
GFR calc non Af Amer: >60 mL/min (ref >60)
Glucose, Bld: 148 mg/dL — ABNORMAL HIGH (ref 65–99)
Potassium: 4.1 mmol/L (ref 3.5–5.1)
SODIUM: 143 mmol/L (ref 135–145)

## 2015-05-10 LAB — TROPONIN I: Troponin I: <0.03 ng/mL (ref <0.031)

## 2015-05-10 LAB — I-STAT TROPONIN, ED: TROPONIN I, POC: 0.01 ng/mL (ref 0.00–0.08)

## 2015-05-10 MED ORDER — DILTIAZEM HCL 25 MG/5ML IV SOLN
10.0000 mg | Freq: Once | INTRAVENOUS | Status: AC
  Administered 2015-05-10: 10 mg via INTRAVENOUS
  Filled 2015-05-10: qty 5

## 2015-05-10 MED ORDER — DILTIAZEM HCL 25 MG/5ML IV SOLN
10.0000 mg | Freq: Once | INTRAVENOUS | Status: AC
Start: 2015-05-10 — End: 2015-05-10
  Administered 2015-05-10: 10 mg via INTRAVENOUS
  Filled 2015-05-10: qty 5

## 2015-05-10 MED ORDER — DILTIAZEM HCL ER COATED BEADS 360 MG PO CP24: 360.0000 mg | ORAL_CAPSULE | Freq: Every day | ORAL | Status: DC

## 2015-05-10 NOTE — Discharge Instructions (Signed)

## 2015-05-10 NOTE — ED Provider Notes (Signed)
CSN: [id number]     Arrival date & time 05/10/15  1018 History   First MD Initiated Contact with Patient 05/10/15 1023     Chief Complaint  Patient presents with  . Atrial Fibrillation     (Consider location/radiation/quality/duration/timing/severity/associated sxs/prior Treatment) Patient is a 69 y.o. male presenting with palpitations.  Palpitations Palpitations quality:  Irregular Onset quality:  Gradual Duration:  6 hours Timing:  Constant Progression:  Unchanged Chronicity:  New Relieved by:  Nothing Worsened by:  Nothing Ineffective treatments:  None tried Associated symptoms: no back pain, no chest pain, no leg pain, no shortness of breath and no vomiting     Past Medical History  Diagnosis Date  . Arthritis of both knees   . BPH (benign prostatic hyperplasia)   . Hypertension   . PAF (paroxysmal atrial fibrillation) (HCC)     s/p TEE/DCCV   Past Surgical History  Procedure Laterality Date  . Cervical spine surgery    . Tee without cardioversion N/A 12/09/2013    Procedure: TRANSESOPHAGEAL ECHOCARDIOGRAM (TEE);  Surgeon: Larey Dresser, MD;  Location: [place];  Service: Cardiovascular;  Laterality: N/A;  . Cardioversion N/A 12/09/2013    Procedure: CARDIOVERSION;  Surgeon: Larey Dresser, MD;  Location: [facility];  Service: Cardiovascular;  Laterality: N/A;   Family History  Problem Relation Age of Onset  . Cancer Mother   . Cancer Father    Social History  Substance Use Topics  . Smoking status: Former Research scientist (life sciences)  . Smokeless tobacco: None  . Alcohol Use: 16.8 oz/week    7 Glasses of wine, 21 Cans of beer per week    Review of Systems  Respiratory: Negative for shortness of breath.   Cardiovascular: Positive for palpitations. Negative for chest pain.  Gastrointestinal: Negative for vomiting.  Musculoskeletal: Negative for back pain.  All other systems reviewed and are negative.     Allergies  Codeine and Penicillins  Home Medications    Prior to Admission medications   Medication Sig Start Date End Date Taking? Authorizing Provider  benazepril (LOTENSIN) 10 MG tablet Take 1 tablet (10 mg total) by mouth daily. 04/04/15  Yes Sueanne Margarita, MD  calcium-vitamin D (OSCAL WITH D) 500-200 MG-UNIT per tablet Take 1 tablet by mouth daily with breakfast.   Yes Historical Provider, MD  Coenzyme Q10 (COQ10) 100 MG CAPS Take 1 capsule by mouth daily.   Yes Historical Provider, MD  finasteride (PROSCAR) 5 MG tablet Take 5 mg by mouth daily. 03/29/15  Yes Historical Provider, MD  flecainide (TAMBOCOR) 100 MG tablet Take 1 tablet (100 mg total) by mouth every 12 (twelve) hours. 04/04/15  Yes Sueanne Margarita, MD  hydrochlorothiazide (HYDRODIURIL) 25 MG tablet Take 1 tablet (25 mg total) by mouth daily. 04/04/15  Yes Sueanne Margarita, MD  KRILL OIL PO Take 1 capsule by mouth daily.   Yes Historical Provider, MD  Magnesium 250 MG TABS Take 1 tablet by mouth daily.   Yes Historical Provider, MD  meloxicam (MOBIC) 15 MG tablet Take 15 mg by mouth daily as needed for pain.   Yes Historical Provider, MD  Multiple Vitamin (MULTIVITAMIN WITH MINERALS) TABS tablet Take 1 tablet by mouth daily.   Yes Historical Provider, MD  OVER THE COUNTER MEDICATION Take 1 capsule by mouth daily. Pt takes Livaplex by mouth daily   Yes Historical Provider, MD  Potassium 99 MG TABS Take 99 mg by mouth daily.   Yes Historical Provider, MD  rivaroxaban Alveda Reasons)  20 MG TABS tablet Take 1 tablet (20 mg total) by mouth daily with supper. 07/18/14  Yes Sueanne Margarita, MD  Tragacanth (ASTRAGALUS ROOT) POWD Take 1 capsule by mouth daily.   Yes Historical Provider, MD  diltiazem (CARDIZEM CD) 360 MG 24 hr capsule Take 1 capsule (360 mg total) by mouth daily. 05/10/15   Debby Freiberg, MD   BP 107/71 mmHg  Pulse 44  Temp(Src) 97.7 F (36.5 C) (Oral)  Resp 16  Ht 5\' 8"  (1.727 m)  Wt 209 lb (94.802 kg)  BMI 31.79 kg/m2  SpO2 98% Physical Exam  Constitutional: He is oriented to  person, place, and time. He appears well-developed and well-nourished.  HENT:  Head: Normocephalic and atraumatic.  Eyes: Conjunctivae and EOM are normal.  Neck: Normal range of motion. Neck supple.  Cardiovascular: Normal heart sounds.  An irregularly irregular rhythm present. Tachycardia present.   Pulmonary/Chest: Effort normal and breath sounds normal. No respiratory distress.  Abdominal: He exhibits no distension. There is no tenderness. There is no rebound and no guarding.  Musculoskeletal: Normal range of motion.  Neurological: He is alert and oriented to person, place, and time.  Skin: Skin is warm and dry.  Vitals reviewed.   ED Course  Procedures (including critical care time) Labs Review Labs Reviewed  CBC WITH DIFFERENTIAL/PLATELET - Abnormal; Notable for the following:    Hemoglobin 17.3 (*)    Monocytes Absolute 1.1 (*)    All other components within normal limits  BASIC METABOLIC PANEL - Abnormal; Notable for the following:    Glucose, Bld 148 (*)    All other components within normal limits  URINALYSIS, ROUTINE W REFLEX MICROSCOPIC (NOT AT [facility]) - Abnormal; Notable for the following:    Leukocytes, UA SMALL (*)    All other components within normal limits  TROPONIN I  URINE RAPID DRUG SCREEN, HOSP PERFORMED  URINE MICROSCOPIC-ADD ON  TROPONIN I  I-STAT TROPOININ, ED    Imaging Review No results found. I have personally reviewed and evaluated these images and lab results as part of my medical decision-making.   EKG Interpretation   Date/Time:  Thursday May 10 2015 11:47:01 EDT Ventricular Rate:  111 PR Interval:    QRS Duration: 92 QT Interval:  464 QTC Calculation: 631 R Axis:   69 Text Interpretation:  Atrial fibrillation Lateral leads are also involved  Prolonged QT interval No significant change since last tracing Confirmed  by Debby Freiberg [phone]) on 05/10/2015 12:54:12 PM      MDM   Final diagnoses:  PAF (paroxysmal atrial  fibrillation) ([place])    69 y.o. male with pertinent PMH of HTN, PAF (on xarelto) presents with palpitations, states similar to prior afib.  On arrival vitals and physical exam as above, mild afib with rvr present.  Physical exam otherwise benign.  Pt denies pain, dyspnea, or other concerning symptoms.  Wu unremarkable.  Spoke with cardiology who recommended upping cardizem dose and will fu on Monday.  DC home in stable condition.    I have reviewed all laboratory and imaging studies if ordered as above  1. PAF (paroxysmal atrial fibrillation) (HCC)         Debby Freiberg, MD 05/11/15 [phone]

## 2015-05-10 NOTE — ED Notes (Signed)
Pt reports waking up to "feeling funny" in chest around 0400, denies pain.  Pt reports pain under LUE.  Pt denies lightheadedness, dizziness, N/V.  Pt reports weakness, diaphoresis. Resp e/u.

## 2015-05-11 ENCOUNTER — Encounter (HOSPITAL_COMMUNITY): Payer: Self-pay | Admitting: Nurse Practitioner

## 2015-05-11 ENCOUNTER — Ambulatory Visit (HOSPITAL_COMMUNITY)
Admission: RE | Admit: 2015-05-11 | Discharge: 2015-05-11 | Disposition: A | Discharge status: Home / Self Care | Payer: Medicare HMO | Source: Ambulatory Visit | Attending: Nurse Practitioner | Admitting: Nurse Practitioner

## 2015-05-11 ENCOUNTER — Telehealth: Payer: Self-pay | Admitting: Cardiology

## 2015-05-11 VITALS — BP 130/78 | HR 107 | Ht 67.0 in | Wt 216.0 lb

## 2015-05-11 DIAGNOSIS — I4819 Other persistent atrial fibrillation: Secondary | ICD-10-CM

## 2015-05-11 DIAGNOSIS — I481 Persistent atrial fibrillation: Secondary | ICD-10-CM | POA: Insufficient documentation

## 2015-05-11 NOTE — Progress Notes (Signed)
Patient ID: Corey Hood, male   DOB: November 01, 1945, 69 y.o.   MRN: [id number]     Primary Care Physician: [name]., MD Referring Physician: ER f/u   Corey Hood is a 69 y.o. male with a h/o PAF, first diagnosed in [place] of 2015 and treated with flecainide 100 mg a day and DCCV. He has been maintaining SR until yesterday,when he woke up at 4 am and was in afib. He went to the ER and was there for many hours, given extra cardizem with slowing of v rates but did not cardiovert. He was given a rx for 360 mg of cardizem, usual dose 240 mg , which he has not filled yet. He is in the afib clinic today with v rate 107 bpm.   He denies snoring, no high caffeine use, water aerobics 3x a week, but does drink 4 beers a night.  Today, he denies symptoms of palpitations, chest pain, shortness of breath, orthopnea, PND, lower extremity edema, dizziness, presyncope, syncope, or neurologic sequela. The patient is tolerating medications without difficulties and is otherwise without complaint today.   Past Medical History  Diagnosis Date  . Arthritis of both knees   . BPH (benign prostatic hyperplasia)   . Hypertension   . PAF (paroxysmal atrial fibrillation) (HCC)     s/p TEE/DCCV   Past Surgical History  Procedure Laterality Date  . Cervical spine surgery    . Tee without cardioversion N/A 12/09/2013    Procedure: TRANSESOPHAGEAL ECHOCARDIOGRAM (TEE);  Surgeon: Larey Dresser, MD;  Location: [place];  Service: Cardiovascular;  Laterality: N/A;  . Cardioversion N/A 12/09/2013    Procedure: CARDIOVERSION;  Surgeon: Larey Dresser, MD;  Location: [facility];  Service: Cardiovascular;  Laterality: N/A;    Current Outpatient Prescriptions  Medication Sig Dispense Refill  . benazepril (LOTENSIN) 10 MG tablet Take 1 tablet (10 mg total) by mouth daily. 30 tablet 11  . calcium-vitamin D (OSCAL WITH D) 500-200 MG-UNIT per tablet Take 1 tablet by mouth daily with breakfast.    . Coenzyme Q10 (COQ10)  100 MG CAPS Take 1 capsule by mouth daily.    Marland Kitchen diltiazem (CARDIZEM CD) 360 MG 24 hr capsule Take 1 capsule (360 mg total) by mouth daily. 30 capsule 0  . finasteride (PROSCAR) 5 MG tablet Take 5 mg by mouth daily.  0  . flecainide (TAMBOCOR) 100 MG tablet Take 1 tablet (100 mg total) by mouth every 12 (twelve) hours. 60 tablet 11  . hydrochlorothiazide (HYDRODIURIL) 25 MG tablet Take 1 tablet (25 mg total) by mouth daily. 30 tablet 11  . KRILL OIL PO Take 1 capsule by mouth daily.    . Magnesium 250 MG TABS Take 1 tablet by mouth daily.    . meloxicam (MOBIC) 15 MG tablet Take 15 mg by mouth daily as needed for pain.    . Multiple Vitamin (MULTIVITAMIN WITH MINERALS) TABS tablet Take 1 tablet by mouth daily.    Marland Kitchen OVER THE COUNTER MEDICATION Take 1 capsule by mouth daily. Pt takes Livaplex by mouth daily    . Potassium 99 MG TABS Take 99 mg by mouth daily.    . rivaroxaban (XARELTO) 20 MG TABS tablet Take 1 tablet (20 mg total) by mouth daily with supper. 30 tablet 6  . Tragacanth (ASTRAGALUS ROOT) POWD Take 1 capsule by mouth daily.     No current facility-administered medications for this encounter.    Allergies  Allergen Reactions  . Codeine Other (  See Comments)    "makes me loopy"  . Penicillins Hives    Social History   Social History  . Marital Status: Married    Spouse Name: N/A  . Number of Children: N/A  . Years of Education: N/A   Occupational History  . Not on file.   Social History Main Topics  . Smoking status: Former Research scientist (life sciences)  . Smokeless tobacco: Not on file  . Alcohol Use: 16.8 oz/week    7 Glasses of wine, 21 Cans of beer per week  . Drug Use: No  . Sexual Activity: Not on file   Other Topics Concern  . Not on file   Social History Narrative    Family History  Problem Relation Age of Onset  . Cancer Mother   . Cancer Father     ROS- All systems are reviewed and negative except as per the HPI above  Physical Exam: Filed Vitals:   05/11/15 1339    BP: 130/78  Pulse: 107  Height: 5\' 7"  (1.702 m)  Weight: 216 lb (97.977 kg)    GEN- The patient is well appearing, alert and oriented x 3 today.   Head- normocephalic, atraumatic Eyes-  Sclera clear, conjunctiva pink Ears- hearing intact Oropharynx- clear Neck- supple, no JVP Lymph- no cervical lymphadenopathy Lungs- Clear to ausculation bilaterally, normal work of breathing Heart-Irregular rate and rhythm, no murmurs, rubs or gallops, PMI not laterally displaced GI- soft, NT, ND, + BS Extremities- no clubbing, cyanosis, or edema MS- no significant deformity or atrophy Skin- no rash or lesion Psych- euthymic mood, full affect Neuro- strength and sensation are intact  EKG-afib with rvr, at 107 bpm, qrs int 144ms, qtc 440 ms Epic records reviewed CBD/Bmet done in er yesterday and acceptable for   Assessment and Plan: 1. Persistent afib Will plan for cardioversion on Monday Will have to have a TEE due to missing a couple of xarelto over the last couple of weeks, last missed dose 4-5 nights ago. Increase Cardizem to 360 mg daily Continue flecainide 100 mg bid  2. Lifestyle issues contributing to afib Decrease alcohol use to no more than 2 drinks a week Congratulated on regular exercise Encouraged to lose 5 to 10 % of current weight  F/u in afib clinic in one week  [name], [facility] [address], [place] [address] [phone]

## 2015-05-11 NOTE — Telephone Encounter (Signed)
New message     Pt is in afib.  Went to the ER yesterday.  They gave him lots of diltiazem and told him to call Dr Radford Pax.  His bp is 155/105 and HR is 125.  Please advise

## 2015-05-11 NOTE — Telephone Encounter (Signed)
Calling stating he woke up yesterday about 4 AM in AFib.  HR was 115-120 and after taking medication and was still elevated he went to ER around 9:30.  He denies having SOB or CP at that time.  In ER was given Diltiazem IV around 10:00 and after couple of hrs HR was still elevated.  Given another dose around 3:00 and states HR did come down to about 100.  Was discharged around 4:00 but HR was still around 100.  Went home and just laid around.  This AM states his HR when he got up was 155/105 HR 125 but had not taken his meds.  The ER increased his Diltiazem to 360 mg but he has not been to drug store to get Rx so took his 240 mg.  Around 10:30 BP 145/100 HR 116.  Denies SOB but states he is very weak and feels "washed out".  Wants to know what to expect and what to do looking forward.  Spoke w/Stacy with Afib clinic.  They can see pt at 1:30 this afternoon.  Advised pt of time and to bring his medications. He verbalizes understanding and will be there at the appointed time.

## 2015-05-11 NOTE — Patient Instructions (Signed)
Cardioversion scheduled for Monday, October 31st  - Arrive at the [facility] and go to admitting at [facility] not eat or drink anything after midnight the night prior to your procedure.  - Take all your medication with a sip of water prior to arrival.  - You will not be able to drive home after your procedure.  Parking code for November 9000

## 2015-05-14 ENCOUNTER — Ambulatory Visit (HOSPITAL_COMMUNITY): Payer: Medicare HMO | Admitting: Certified Registered"

## 2015-05-14 ENCOUNTER — Ambulatory Visit (HOSPITAL_BASED_OUTPATIENT_CLINIC_OR_DEPARTMENT_OTHER)
Admission: RE | Admit: 2015-05-14 | Discharge: 2015-05-14 | Disposition: A | Discharge status: Home / Self Care | Payer: Medicare HMO | Source: Ambulatory Visit | Attending: Nurse Practitioner | Admitting: Nurse Practitioner

## 2015-05-14 ENCOUNTER — Ambulatory Visit (HOSPITAL_COMMUNITY)
Admission: RE | Admit: 2015-05-14 | Discharge: 2015-05-14 | Disposition: A | Discharge status: Home / Self Care | Payer: Medicare HMO | Source: Ambulatory Visit | Attending: Internal Medicine | Admitting: Internal Medicine

## 2015-05-14 ENCOUNTER — Encounter (HOSPITAL_COMMUNITY)
Admission: RE | Disposition: A | Discharge status: Home / Self Care | Payer: Self-pay | Source: Ambulatory Visit | Attending: Internal Medicine

## 2015-05-14 ENCOUNTER — Ambulatory Visit (HOSPITAL_BASED_OUTPATIENT_CLINIC_OR_DEPARTMENT_OTHER)
Admission: RE | Admit: 2015-05-14 | Discharge: 2015-05-14 | Disposition: A | Discharge status: Home / Self Care | Payer: Medicare HMO | Source: Ambulatory Visit | Admitting: Internal Medicine

## 2015-05-14 ENCOUNTER — Encounter (HOSPITAL_COMMUNITY): Payer: Self-pay | Admitting: Internal Medicine

## 2015-05-14 DIAGNOSIS — I4892 Unspecified atrial flutter: Secondary | ICD-10-CM | POA: Diagnosis present

## 2015-05-14 DIAGNOSIS — I10 Essential (primary) hypertension: Secondary | ICD-10-CM | POA: Diagnosis not present

## 2015-05-14 DIAGNOSIS — Z87891 Personal history of nicotine dependence: Secondary | ICD-10-CM | POA: Insufficient documentation

## 2015-05-14 DIAGNOSIS — I340 Nonrheumatic mitral (valve) insufficiency: Secondary | ICD-10-CM | POA: Diagnosis not present

## 2015-05-14 DIAGNOSIS — I483 Typical atrial flutter: Secondary | ICD-10-CM | POA: Insufficient documentation

## 2015-05-14 DIAGNOSIS — I480 Paroxysmal atrial fibrillation: Secondary | ICD-10-CM | POA: Diagnosis not present

## 2015-05-14 DIAGNOSIS — M1990 Unspecified osteoarthritis, unspecified site: Secondary | ICD-10-CM | POA: Insufficient documentation

## 2015-05-14 HISTORY — PX: TEE WITHOUT CARDIOVERSION: SHX5443

## 2015-05-14 HISTORY — PX: CARDIOVERSION: SHX1299

## 2015-05-14 SURGERY — CARDIOVERSION
Anesthesia: General

## 2015-05-14 MED ORDER — SODIUM CHLORIDE 0.9 % IV SOLN
INTRAVENOUS | Status: DC
  Administered 2015-05-14: 12:00:00 via INTRAVENOUS
  Administered 2015-05-14: 500 mL via INTRAVENOUS

## 2015-05-14 MED ORDER — MIDAZOLAM HCL 5 MG/5ML IJ SOLN
INTRAMUSCULAR | Status: DC | PRN
  Administered 2015-05-14: 2 mg via INTRAVENOUS

## 2015-05-14 MED ORDER — PROPOFOL 10 MG/ML IV BOLUS
INTRAVENOUS | Status: DC | PRN
  Administered 2015-05-14: 20 mg via INTRAVENOUS

## 2015-05-14 MED ORDER — BUTAMBEN-TETRACAINE-BENZOCAINE 2-2-14 % EX AERO
INHALATION_SPRAY | CUTANEOUS | Status: DC | PRN
  Administered 2015-05-14: 2 via TOPICAL

## 2015-05-14 MED ORDER — LIDOCAINE VISCOUS 2 % MT SOLN
OROMUCOSAL | Status: AC
  Filled 2015-05-14: qty 15

## 2015-05-14 MED ORDER — PROPOFOL 500 MG/50ML IV EMUL
INTRAVENOUS | Status: DC | PRN
  Administered 2015-05-14: 80 ug/kg/min via INTRAVENOUS

## 2015-05-14 MED ORDER — MIDAZOLAM HCL 2 MG/2ML IJ SOLN
INTRAMUSCULAR | Status: AC
  Filled 2015-05-14: qty 2

## 2015-05-14 NOTE — H&P (Signed)
     INTERVAL PROCEDURE H&P  History and Physical Interval Note:  05/14/2015 11:36 AM  Corey Hood has presented today for their planned procedure. The various methods of treatment have been discussed with the patient and family. After consideration of risks, benefits and other options for treatment, the patient has consented to the procedure.  The patients' outpatient history has been reviewed, patient examined, and no change in status from most recent office note within the past 30 days. I have reviewed the patients' chart and labs and will proceed as planned. Questions were answered to the patient's satisfaction.   Pixie Casino, MD, [facility] Attending Cardiologist [place] 05/14/2015, 11:36 AM

## 2015-05-14 NOTE — Progress Notes (Signed)
Echocardiogram Echocardiogram Transesophageal has been performed.  Joelene Millin 05/14/2015, 12:49 PM

## 2015-05-14 NOTE — CV Procedure (Signed)
TEE/CARDIOVERSION NOTE  TRANSESOPHAGEAL ECHOCARDIOGRAM (TEE):  Indictation: Atrial Flutter  Consent:   Informed consent was obtained prior to the procedure. The risks, benefits and alternatives for the procedure were discussed and the patient comprehended these risks.  Risks include, but are not limited to, cough, sore throat, vomiting, nausea, somnolence, esophageal and stomach trauma or perforation, bleeding, low blood pressure, aspiration, pneumonia, infection, trauma to the teeth and death.    Time Out: Verified patient identification, verified procedure, site/side was marked, verified correct patient position, special equipment/implants available, medications/allergies/relevent history reviewed, required imaging and test results available. Performed  Procedure:  After a procedural time-out, the patient was given Propofol for sedation.  The oropharynx was anesthetized 2 cetacaine sprays.  The transesophageal probe was inserted in the esophagus and stomach without difficulty and multiple views were obtained.  The patient was kept under observation until the patient left the procedure room.  The patient left the procedure room in stable condition.   Agitated microbubble saline contrast was not administered.  Complications:    Complications: None Patient did tolerate procedure well.  Findings:  1. LEFT VENTRICLE: The left ventricular wall thickness is mildly increased.  The left ventricular cavity is normal in size. Wall motion is normal.  LVEF is 60-65%.  2. RIGHT VENTRICLE:  The right ventricle is normal in structure and function without any thrombus or masses.    3. LEFT ATRIUM:  The left atrium is dilated in size without any thrombus or masses.  There is not spontaneous echo contrast ("smoke") in the left atrium consistent with a low flow state.  4. LEFT ATRIAL APPENDAGE:  The left atrial appendage is free of any thrombus or masses. The appendage has single lobes. Pulse  doppler indicates low flow in the appendage.  5. ATRIAL SEPTUM:  The atrial septum appears intact and is free of thrombus and/or masses.  There is no evidence for interatrial shunting by color doppler and saline microbubble.  6. RIGHT ATRIUM:  The right atrium is normal in size and function without any thrombus or masses.  7. MITRAL VALVE:  The mitral valve is normal in structure and function with Mild regurgitation.  There were no vegetations or stenosis.  8. AORTIC VALVE:  The aortic valve is trileaflet, normal in structure and function with no regurgitation.  There were no vegetations or stenosis  9. TRICUSPID VALVE:  The tricuspid valve is normal in structure and function with trivial regurgitation.  There were no vegetations or stenosis  10.  PULMONIC VALVE:  The pulmonic valve is normal in structure and function with no regurgitation.  There were no vegetations or stenosis.   11. AORTIC ARCH, ASCENDING AND DESCENDING AORTA:  There was grade 1 Ron Parker et. Al, 1992) atherosclerosis of the proximal descending aorta.  12. PULMONARY VEINS: Anomalous pulmonary venous return was not noted.  13. PERICARDIUM: The pericardium appeared normal and non-thickened.  There is no pericardial effusion.  CARDIOVERSION:     Second Time Out: Verified patient identification, verified procedure, site/side was marked, verified correct patient position, special equipment/implants available, medications/allergies/relevent history reviewed, required imaging and test results available.  Performed  Procedure:  1. Patient placed on cardiac monitor, pulse oximetry, supplemental oxygen as necessary.  2. Sedation administered per anesthesia 3. Pacer pads placed anterior and posterior chest. 4. Cardioverted 1 time(s).  5. Cardioverted at 150J biphasic.  Complications:  Complications: None Patient did tolerate procedure well.  Impression:  1. No LAA thrombus 2. Mild MR 3. LVEF  60-65% 4. Successful DCCV  with a single 150J biphasic shock  Recommendations:  1. Continue current antiarrythmic medications and Xarelto. 2. Highly encouraged continued alcohol abstinence. 3. Follow-up with Roderic Palau, NP in the a-fib clinic.  Time Spent Directly with the Patient:  30 minutes   Pixie Casino, MD, [facility] Attending Cardiologist [facility]  05/14/2015, 12:42 PM

## 2015-05-14 NOTE — Anesthesia Preprocedure Evaluation (Signed)
Anesthesia Evaluation  Patient identified by MRN, date of birth, ID band Patient awake    Reviewed: Allergy & Precautions, H&P , NPO status , Patient's Chart, lab work & pertinent test results  Airway Mallampati: II  TM Distance: >3 FB Neck ROM: Full    Dental no notable dental hx.    Pulmonary former smoker,    Pulmonary exam normal breath sounds clear to auscultation       Cardiovascular hypertension, Normal cardiovascular exam+ dysrhythmias Atrial Fibrillation  Rhythm:Regular Rate:Normal     Neuro/Psych    GI/Hepatic   Endo/Other    Renal/GU      Musculoskeletal  (+) Arthritis ,   Abdominal   Peds  Hematology   Anesthesia Other Findings   Reproductive/Obstetrics                             Anesthesia Physical  Anesthesia Plan  ASA: III  Anesthesia Plan: General   Post-op Pain Management:    Induction: Intravenous  Airway Management Planned: Mask  Additional Equipment:   Intra-op Plan:   Post-operative Plan:   Informed Consent: I have reviewed the patients History and Physical, chart, labs and discussed the procedure including the risks, benefits and alternatives for the proposed anesthesia with the patient or authorized representative who has indicated his/her understanding and acceptance.   Dental advisory given  Plan Discussed with: CRNA  Anesthesia Plan Comments:         Anesthesia Quick Evaluation

## 2015-05-14 NOTE — Transfer of Care (Signed)
Immediate Anesthesia Transfer of Care Note  Patient: Corey Hood  Procedure(s) Performed: Procedure(s): CARDIOVERSION (N/A) TRANSESOPHAGEAL ECHOCARDIOGRAM (TEE) (N/A)  Patient Location: PACU  Anesthesia Type:General  Level of Consciousness: awake, alert , oriented and sedated  Airway & Oxygen Therapy: Patient Spontanous Breathing and Patient connected to nasal cannula oxygen  Post-op Assessment: Report given to RN, Post -op Vital signs reviewed and stable and Patient moving all extremities  Post vital signs: Reviewed and stable  Last Vitals:  Filed Vitals:   05/14/15 1118  BP: 166/89  Pulse: 119  Temp: 36.7 C  Resp: 10    Complications: No apparent anesthesia complications

## 2015-05-14 NOTE — Anesthesia Postprocedure Evaluation (Signed)
  Anesthesia Post-op Note  Patient: Corey Hood  Procedure(s) Performed: Procedure(s): CARDIOVERSION (N/A) TRANSESOPHAGEAL ECHOCARDIOGRAM (TEE) (N/A)  Patient Location: Endoscopy Unit  Anesthesia Type:MAC  Level of Consciousness: awake  Airway and Oxygen Therapy: Patient Spontanous Breathing  Post-op Pain: none  Post-op Assessment: Post-op Vital signs reviewed, Patient's Cardiovascular Status Stable, Respiratory Function Stable, Patent Airway, No signs of Nausea or vomiting and Pain level controlled              Post-op Vital Signs: Reviewed and stable  Last Vitals:  Filed Vitals:   05/14/15 1310  BP: 107/61  Pulse: 71  Temp:   Resp:     Complications: No apparent anesthesia complications

## 2015-05-14 NOTE — Discharge Instructions (Signed)
Electrical Cardioversion Electrical cardioversion is the delivery of a jolt of electricity to change the rhythm of the heart. Sticky patches or metal paddles are placed on the chest to deliver the electricity from a device. This is done to restore a normal rhythm. A rhythm that is too fast or not regular keeps the heart from pumping well. Electrical cardioversion is done in an emergency if:   There is low or no blood pressure as a result of the heart rhythm.   Normal rhythm must be restored as fast as possible to protect the brain and heart from further damage.   It may save a life. Cardioversion may be done for heart rhythms that are not immediately life threatening, such as atrial fibrillation or flutter, in which:   The heart is beating too fast or is not regular.   Medicine to change the rhythm has not worked.   It is safe to wait in order to allow time for preparation.  Symptoms of the abnormal rhythm are bothersome.  The risk of stroke and other serious problems can be reduced. LET [facility] CARE PROVIDER KNOW ABOUT:   Any allergies you have.  All medicines you are taking, including vitamins, herbs, eye drops, creams, and over-the-counter medicines.  Previous problems you or members of your family have had with the use of anesthetics.   Any blood disorders you have.   Previous surgeries you have had.   Medical conditions you have. RISKS AND COMPLICATIONS  Generally, this is a safe procedure. However, problems can occur and include:   Breathing problems related to the anesthetic used.  A blood clot that breaks free and travels to other parts of your body. This could cause a stroke or other problems. The risk of this is lowered by use of blood-thinning medicine (anticoagulant) prior to the procedure.  Cardiac arrest (rare). BEFORE THE PROCEDURE   You may have tests to detect blood clots in your heart and to evaluate heart function.  You may start taking  anticoagulants so your blood does not clot as easily.   Medicines may be given to help stabilize your heart rate and rhythm. PROCEDURE  You will be given medicine through an IV tube to reduce discomfort and make you sleepy (sedative).   An electrical shock will be delivered. AFTER THE PROCEDURE Your heart rhythm will be watched to make sure it does not change.    This information is not intended to replace advice given to you by your health care provider. Make sure you discuss any questions you have with your health care provider.   Document Released: 06/20/2002 Document Revised: 07/21/2014 Document Reviewed: 01/12/2013 Elsevier Interactive Patient Education 2016 [place]. Transesophageal Echocardiogram Transesophageal echocardiography (TEE) is a special type of test that produces images of the heart by using sound waves (echocardiogram). This type of echocardiography can obtain better images of the heart than standard echocardiography. TEE is done by passing a flexible tube down the esophagus. The heart is located in front of the esophagus. Because the heart and esophagus are close to one another, your health care provider can take very clear, detailed pictures of the heart via ultrasound waves. TEE may be done:  If your health care provider needs more information based on standard echocardiography findings.  If you had a stroke. This might have happened because a clot formed in your heart. TEE can visualize different areas of the heart and check for clots.  To check valve anatomy and function.  To  check for infection on the inside of your heart (endocarditis).  To evaluate the dividing wall (septum) of the heart and presence of a hole that did not close after birth (patent foramen ovale or atrial septal defect).  To help diagnose a tear in the wall of the aorta (aortic dissection).  During cardiac valve surgery. This allows the surgeon to assess the valve repair before closing  the chest.  During a variety of other cardiac procedures to guide positioning of catheters.  Sometimes before a cardioversion, which is a shock to convert heart rhythm back to normal. LET [facility] CARE PROVIDER KNOW ABOUT:   Any allergies you have.  All medicines you are taking, including vitamins, herbs, eye drops, creams, and over-the-counter medicines.  Previous problems you or members of your family have had with the use of anesthetics.  Any blood disorders you have.  Previous surgeries you have had.  Medical conditions you have.  Swallowing difficulties.  An esophageal obstruction. RISKS AND COMPLICATIONS  Generally, TEE is a safe procedure. However, as with any procedure, complications can occur. Possible complications include an esophageal tear (rupture). BEFORE THE PROCEDURE   Do not eat or drink for 6 hours before the procedure or as directed by your health care provider.  Arrange for someone to drive you home after the procedure. Do not drive yourself home. During the procedure, you will be given medicines that can continue to make you feel drowsy and can impair your reflexes.  An IV access tube will be started in the arm. PROCEDURE   A medicine to help you relax (sedative) will be given through the IV access tube.  A medicine may be sprayed or gargled to numb the back of the throat.  Your blood pressure, heart rate, and breathing (vital signs) will be monitored during the procedure.  The TEE probe is a long, flexible tube. The tip of the probe is placed into the back of the mouth, and you will be asked to swallow. This helps to pass the tip of the probe into the esophagus. Once the tip of the probe is in the correct area, your health care provider can take pictures of the heart.  TEE is usually not a painful procedure. You may feel the probe press against the back of the throat. The probe does not enter the trachea and does not affect your breathing. AFTER THE  PROCEDURE   You will be in bed, resting, until you have fully returned to consciousness.  When you first awaken, your throat may feel slightly sore and will probably still feel numb. This will improve slowly over time.  You will not be allowed to eat or drink until it is clear that the numbness has improved.  Once you have been able to drink, urinate, and sit on the edge of the bed without feeling sick to your stomach (nausea) or dizzy, you may be cleared to go home.  You should have a friend or family member with you for the next 24 hours after your procedure.   This information is not intended to replace advice given to you by your health care provider. Make sure you discuss any questions you have with your health care provider.   Document Released: 09/20/2002 Document Revised: 07/05/2013 Document Reviewed: 12/30/2012 Elsevier Interactive Patient Education [employer].

## 2015-05-14 NOTE — Progress Notes (Addendum)
Pt's visit was EKG check only . Butch Penny will review with pt.   Ekg was to confirm that pt was still in afib prior to cardioversion this am, which he was and he went on to endoscopy to have his scheduled cardioversion performed.Marland Kitchen

## 2015-05-15 ENCOUNTER — Encounter (HOSPITAL_COMMUNITY): Payer: Self-pay | Admitting: Internal Medicine

## 2015-05-22 ENCOUNTER — Encounter (HOSPITAL_COMMUNITY): Payer: Self-pay | Admitting: Nurse Practitioner

## 2015-05-22 ENCOUNTER — Ambulatory Visit (HOSPITAL_COMMUNITY)
Admission: RE | Admit: 2015-05-22 | Discharge: 2015-05-22 | Disposition: A | Discharge status: Home / Self Care | Payer: Medicare HMO | Source: Ambulatory Visit | Attending: Nurse Practitioner | Admitting: Nurse Practitioner

## 2015-05-22 VITALS — BP 132/66 | HR 68 | Ht 68.0 in | Wt 213.6 lb

## 2015-05-22 DIAGNOSIS — I481 Persistent atrial fibrillation: Secondary | ICD-10-CM | POA: Insufficient documentation

## 2015-05-22 DIAGNOSIS — I4819 Other persistent atrial fibrillation: Secondary | ICD-10-CM

## 2015-05-22 NOTE — Progress Notes (Signed)
Patient ID: ATA PECHA, male   DOB: February 08, 1946, 69 y.o.   MRN: [id number]     Primary Care Physician: [name]., MD Referring Physician: ER f/u   Corey Hood is a 69 y.o. male with a h/o PAF, first diagnosed in [place] of 2015 and treated with flecainide 100 mg a day and DCCV. He has been maintaining SR until yesterday,when he woke up at 4 am and was in afib. He went to the ER and was there for many hours, given extra cardizem with slowing of v rates but did not cardiovert. He was given a rx for 360 mg of cardizem, usual dose 240 mg , which he has not filled yet. He is in the afib clinic today with v rate 107 bpm.   He denies snoring, no high caffeine use, water aerobics 3x a week, but does drink 4 beers a night.  He was set up for cardioversion which was performed successfully on  10/31. He returns to the afib clinic today and is maintaining SR .He stopped drinking beer cold [place].  He has lost 3 lbs.  Today, he denies symptoms of palpitations, chest pain, shortness of breath, orthopnea, PND, lower extremity edema, dizziness, presyncope, syncope, or neurologic sequela. The patient is tolerating medications without difficulties and is otherwise without complaint today.   Past Medical History  Diagnosis Date  . Arthritis of both knees   . BPH (benign prostatic hyperplasia)   . Hypertension   . PAF (paroxysmal atrial fibrillation) (HCC)     s/p TEE/DCCV   Past Surgical History  Procedure Laterality Date  . Cervical spine surgery    . Tee without cardioversion N/A 12/09/2013    Procedure: TRANSESOPHAGEAL ECHOCARDIOGRAM (TEE);  Surgeon: Larey Dresser, MD;  Location: [place];  Service: Cardiovascular;  Laterality: N/A;  . Cardioversion N/A 12/09/2013    Procedure: CARDIOVERSION;  Surgeon: Larey Dresser, MD;  Location: [place];  Service: Cardiovascular;  Laterality: N/A;  . Cardioversion N/A 05/14/2015    Procedure: CARDIOVERSION;  Surgeon: Pixie Casino, MD;  Location: [facility]  ENDOSCOPY;  Service: Cardiovascular;  Laterality: N/A;  . Tee without cardioversion N/A 05/14/2015    Procedure: TRANSESOPHAGEAL ECHOCARDIOGRAM (TEE);  Surgeon: Pixie Casino, MD;  Location: [facility] ENDOSCOPY;  Service: Cardiovascular;  Laterality: N/A;    Current Outpatient Prescriptions  Medication Sig Dispense Refill  . benazepril (LOTENSIN) 10 MG tablet Take 1 tablet (10 mg total) by mouth daily. 30 tablet 11  . calcium-vitamin D (OSCAL WITH D) 500-200 MG-UNIT per tablet Take 1 tablet by mouth daily with breakfast.    . Coenzyme Q10 (COQ10) 100 MG CAPS Take 1 capsule by mouth daily.    Marland Kitchen diltiazem (TIAZAC) 240 MG 24 hr capsule Take 240 mg by mouth daily.    . finasteride (PROSCAR) 5 MG tablet Take 5 mg by mouth daily.  0  . flecainide (TAMBOCOR) 100 MG tablet Take 1 tablet (100 mg total) by mouth every 12 (twelve) hours. 60 tablet 11  . hydrochlorothiazide (HYDRODIURIL) 25 MG tablet Take 1 tablet (25 mg total) by mouth daily. 30 tablet 11  . KRILL OIL PO Take 1 capsule by mouth daily.    . Magnesium 250 MG TABS Take 1 tablet by mouth daily.    . meloxicam (MOBIC) 15 MG tablet Take 15 mg by mouth daily as needed for pain.    . Multiple Vitamin (MULTIVITAMIN WITH MINERALS) TABS tablet Take 1 tablet by mouth daily.    Marland Kitchen  OVER THE COUNTER MEDICATION Take 1 capsule by mouth daily. Pt takes Livaplex by mouth daily    . Potassium 99 MG TABS Take 99 mg by mouth daily.    . rivaroxaban (XARELTO) 20 MG TABS tablet Take 1 tablet (20 mg total) by mouth daily with supper. 30 tablet 6  . Tragacanth (ASTRAGALUS ROOT) POWD Take 1 capsule by mouth daily.     No current facility-administered medications for this encounter.    Allergies  Allergen Reactions  . Codeine Other (See Comments)    "makes me loopy"  . Penicillins Hives    Social History   Social History  . Marital Status: Married    Spouse Name: N/A  . Number of Children: N/A  . Years of Education: N/A   Occupational History  . Not on  file.   Social History Main Topics  . Smoking status: Former Research scientist (life sciences)  . Smokeless tobacco: Not on file  . Alcohol Use: 16.8 oz/week    7 Glasses of wine, 21 Cans of beer per week  . Drug Use: No  . Sexual Activity: Not on file   Other Topics Concern  . Not on file   Social History Narrative    Family History  Problem Relation Age of Onset  . Cancer Mother   . Cancer Father     ROS- All systems are reviewed and negative except as per the HPI above  Physical Exam: Filed Vitals:   05/22/15 1405  BP: 132/66  Pulse: 68  Height: 5\' 8"  (1.727 m)  Weight: 213 lb 9.6 oz (96.888 kg)    GEN- The patient is well appearing, alert and oriented x 3 today.   Head- normocephalic, atraumatic Eyes-  Sclera clear, conjunctiva pink Ears- hearing intact Oropharynx- clear Neck- supple, no JVP Lymph- no cervical lymphadenopathy Lungs- Clear to ausculation bilaterally, normal work of breathing Heart- regular rate and rhythm, no murmurs, rubs or gallops, PMI not laterally displaced GI- soft, NT, ND, + BS Extremities- no clubbing, cyanosis, or edema MS- no significant deformity or atrophy Skin- no rash or lesion Psych- euthymic mood, full affect Neuro- strength and sensation are intact  EKG- NSR, 68 bpm, IRBBB, pr  Int 196 ms, QRS int 104 ms, QTc 414 ms Epic records reviewed   Assessment and Plan: 1. Persistent afib S/p successful DCCV Reduce  Cardizem  to 240 mg daily Continue flecainide 100 mg bid Do not miss any doses of xarelto  2. Lifestyle issues contributing to afib Congratulated on stopping alcohol use  Congratulated on regular exercise Encouraged to lose 5 to 10 % of current weight  F/u in afib clinic as needed Dr. Radford Pax as per recall spring 2017  Corey Hood, [facility] [address] [place], [place] [address] [phone]

## 2015-05-22 NOTE — Patient Instructions (Signed)
Your physician has recommended you make the following change in your medication:   1) Stop taking Diliazem 360  2) GO back to 240 daily  3) Call afib clinic as needed. 4)  Follow up appointment with Dr. Radford Pax on scheduled date and time.

## 2015-10-11 ENCOUNTER — Encounter: Payer: Self-pay | Admitting: Cardiology

## 2015-10-11 ENCOUNTER — Ambulatory Visit (INDEPENDENT_AMBULATORY_CARE_PROVIDER_SITE_OTHER): Payer: Medicare HMO | Admitting: Cardiology

## 2015-10-11 VITALS — BP 120/64 | HR 67 | Ht 68.0 in | Wt 209.8 lb

## 2015-10-11 DIAGNOSIS — I480 Paroxysmal atrial fibrillation: Secondary | ICD-10-CM | POA: Diagnosis not present

## 2015-10-11 DIAGNOSIS — I10 Essential (primary) hypertension: Secondary | ICD-10-CM | POA: Diagnosis not present

## 2015-10-11 LAB — CBC WITH DIFFERENTIAL/PLATELET
Basophils Absolute: 0.1 10*3/uL (ref 0.0–0.1)
Basophils Relative: 1 % (ref 0–1)
EOS PCT: 3 % (ref 0–5)
Eosinophils Absolute: 0.2 10*3/uL (ref 0.0–0.7)
HEMATOCRIT: 42.9 % (ref 39.0–52.0)
Hemoglobin: 14.9 g/dL (ref 13.0–17.0)
LYMPHS ABS: 1.4 10*3/uL (ref 0.7–4.0)
LYMPHS PCT: 24 % (ref 12–46)
MCH: 33 pg (ref 26.0–34.0)
MCHC: 34.7 g/dL (ref 30.0–36.0)
MCV: 94.9 fL (ref 78.0–100.0)
MPV: 10.3 fL (ref 8.6–12.4)
Monocytes Absolute: 0.6 10*3/uL (ref 0.1–1.0)
Monocytes Relative: 10 % (ref 3–12)
NEUTROS ABS: 3.6 10*3/uL (ref 1.7–7.7)
NEUTROS PCT: 62 % (ref 43–77)
Platelets: 195 10*3/uL (ref 150–400)
RBC: 4.52 MIL/uL (ref 4.22–5.81)
RDW: 12.8 % (ref 11.5–15.5)
WBC: 5.8 10*3/uL (ref 4.0–10.5)

## 2015-10-11 LAB — BASIC METABOLIC PANEL
BUN: 17 mg/dL (ref 7–25)
CO2: 27 mmol/L (ref 20–31)
Calcium: 9.1 mg/dL (ref 8.6–10.3)
Chloride: 105 mmol/L (ref 98–110)
Creat: 0.85 mg/dL (ref 0.70–1.25)
Glucose, Bld: 99 mg/dL (ref 65–99)
POTASSIUM: 4.2 mmol/L (ref 3.5–5.3)
SODIUM: 139 mmol/L (ref 135–146)

## 2015-10-11 NOTE — Patient Instructions (Signed)
Medication Instructions:  Your physician recommends that you continue on your current medications as directed. Please refer to the Current Medication list given to you today.   Labwork: TODAY: CBC, BMET  Testing/Procedures: None  Follow-Up: Your physician wants you to follow-up in: 6 months with Dr. Radford Pax. You will receive a reminder letter in the mail two months in advance. If you don't receive a letter, please call our office to schedule the follow-up appointment.   Any Other Special Instructions Will Be Listed Below (If Applicable).     If you need a refill on your cardiac medications before your next appointment, please call your pharmacy.

## 2015-10-11 NOTE — Progress Notes (Signed)
Cardiology Office Note   Date:  10/11/2015   ID:  Corey Hood, DOB 01/03/1946, MRN [id number]  PCP:  [name]., MD    Chief Complaint  Patient presents with  . Atrial Fibrillation      History of Present Illness: Corey Hood is a 70 y.o. male with PAF s/p TEE/DCCV.  He presents today for followup. He is doing well. He denies any chest pain, SOB, DOE, LE edema, dizziness, palpitations or syncope.   Past Medical History  Diagnosis Date  . Arthritis of both knees   . BPH (benign prostatic hyperplasia)   . Hypertension   . PAF (paroxysmal atrial fibrillation) (HCC)     s/p TEE/DCCV    Past Surgical History  Procedure Laterality Date  . Cervical spine surgery    . Tee without cardioversion N/A 12/09/2013    Procedure: TRANSESOPHAGEAL ECHOCARDIOGRAM (TEE);  Surgeon: Larey Dresser, MD;  Location: [place];  Service: Cardiovascular;  Laterality: N/A;  . Cardioversion N/A 12/09/2013    Procedure: CARDIOVERSION;  Surgeon: Larey Dresser, MD;  Location: [place];  Service: Cardiovascular;  Laterality: N/A;  . Cardioversion N/A 05/14/2015    Procedure: CARDIOVERSION;  Surgeon: Pixie Casino, MD;  Location: [facility] ENDOSCOPY;  Service: Cardiovascular;  Laterality: N/A;  . Tee without cardioversion N/A 05/14/2015    Procedure: TRANSESOPHAGEAL ECHOCARDIOGRAM (TEE);  Surgeon: Pixie Casino, MD;  Location: [facility] ENDOSCOPY;  Service: Cardiovascular;  Laterality: N/A;     Current Outpatient Prescriptions  Medication Sig Dispense Refill  . albuterol (PROAIR HFA) 108 (90 Base) MCG/ACT inhaler Inhale 2 puffs into the lungs every 4 (four) hours as needed. (wheezing)    . benazepril (LOTENSIN) 10 MG tablet Take 1 tablet (10 mg total) by mouth daily. 30 tablet 11  . calcium-vitamin D (OSCAL WITH D) 500-200 MG-UNIT per tablet Take 1 tablet by mouth daily with breakfast.    . Coenzyme Q10 (COQ10) 100 MG CAPS Take 1 capsule by mouth daily.    Marland Kitchen diltiazem (TIAZAC)  240 MG 24 hr capsule Take 240 mg by mouth daily.    . finasteride (PROSCAR) 5 MG tablet Take 5 mg by mouth daily.  0  . flecainide (TAMBOCOR) 100 MG tablet Take 1 tablet (100 mg total) by mouth every 12 (twelve) hours. 60 tablet 11  . hydrochlorothiazide (HYDRODIURIL) 25 MG tablet Take 1 tablet (25 mg total) by mouth daily. 30 tablet 11  . KRILL OIL PO Take 1 capsule by mouth daily.    . Magnesium 250 MG TABS Take 1 tablet by mouth daily.    . meloxicam (MOBIC) 15 MG tablet Take 15 mg by mouth daily as needed for pain.    . Multiple Vitamin (MULTIVITAMIN WITH MINERALS) TABS tablet Take 1 tablet by mouth daily.    Marland Kitchen OVER THE COUNTER MEDICATION Take 1 capsule by mouth daily. Pt takes Livaplex by mouth daily    . Potassium 99 MG TABS Take 99 mg by mouth daily.    . rivaroxaban (XARELTO) 20 MG TABS tablet Take 1 tablet (20 mg total) by mouth daily with supper. 30 tablet 6  . Tragacanth (ASTRAGALUS ROOT) POWD Take 1 capsule by mouth daily.     No current facility-administered medications for this visit.    Allergies:   Codeine and Penicillins    Social History:  The patient  reports that he has quit smoking. He  has never used smokeless tobacco. He reports that he drinks about 16.8 oz of alcohol per week. He reports that he does not use illicit drugs.   Family History:  The patient's family history includes Cancer in his father and mother.    ROS:  Please see the history of present illness.   Otherwise, review of systems are positive for none.   All other systems are reviewed and negative.    PHYSICAL EXAM: VS:  BP 120/64 mmHg  Pulse 67  Ht 5\' 8"  (1.727 m)  Wt 209 lb 12.8 oz (95.165 kg)  BMI 31.91 kg/m2 , BMI Body mass index is 31.91 kg/(m^2). GEN: Well nourished, well developed, in no acute distress HEENT: normal Neck: no JVD, carotid bruits, or masses Cardiac: RRR; no murmurs, rubs, or gallops,no edema  Respiratory:  clear to auscultation bilaterally, normal work of breathing GI:  soft, nontender, nondistended, + BS MS: no deformity or atrophy Skin: warm and dry, no rash Neuro:  Strength and sensation are intact Psych: euthymic mood, full affect   EKG:  EKG is not ordered today.    Recent Labs: 05/10/2015: BUN 15; Creatinine, Ser 1.04; Hemoglobin 17.3*; Platelets 204; Potassium 4.1; Sodium 143    Lipid Panel No results found for: CHOL, TRIG, HDL, CHOLHDL, VLDL, LDLCALC, LDLDIRECT    Wt Readings from Last 3 Encounters:  10/11/15 209 lb 12.8 oz (95.165 kg)  05/22/15 213 lb 9.6 oz (96.888 kg)  05/14/15 216 lb (97.977 kg)    ASSESSMENT AND PLAN:  1. Paroxysmal atrial fibrillation now in NSR on Flecainide/Xarelto and Cardizem s/p TEE/DCCV. I will check a NOAC panel today.  Continue NOAC/Flecainide/Cardizem 2. HTN controlled  - continue diltiazem/benazepril/HCTZ   Current medicines are reviewed at length with the patient today.  The patient does not have concerns regarding medicines.  The following changes have been made:  no change  Labs/ tests ordered today: See above Assessment and Plan No orders of the defined types were placed in this encounter.     Disposition:   FU with me in 6 months  Signed, Sueanne Margarita, MD  10/11/2015 11:12 AM    [facility] [place], [place], [place]  [address] Phone: [phone]; Fax: [phone]

## 2015-10-12 ENCOUNTER — Telehealth: Payer: Self-pay | Admitting: *Deleted

## 2015-10-12 NOTE — Telephone Encounter (Signed)
Pt notified of lab results by phone with verbal understanding.  

## 2016-03-22 ENCOUNTER — Other Ambulatory Visit: Payer: Self-pay | Admitting: Cardiology

## 2016-03-25 ENCOUNTER — Other Ambulatory Visit: Payer: Self-pay | Admitting: Cardiology

## 2016-04-08 ENCOUNTER — Ambulatory Visit (INDEPENDENT_AMBULATORY_CARE_PROVIDER_SITE_OTHER): Payer: Medicare HMO | Admitting: Cardiology

## 2016-04-08 ENCOUNTER — Encounter: Payer: Self-pay | Admitting: Cardiology

## 2016-04-08 VITALS — BP 140/86 | HR 67 | Ht 68.0 in | Wt 215.8 lb

## 2016-04-08 DIAGNOSIS — I480 Paroxysmal atrial fibrillation: Secondary | ICD-10-CM | POA: Diagnosis not present

## 2016-04-08 DIAGNOSIS — E669 Obesity, unspecified: Secondary | ICD-10-CM

## 2016-04-08 DIAGNOSIS — I10 Essential (primary) hypertension: Secondary | ICD-10-CM

## 2016-04-08 HISTORY — DX: Obesity, unspecified: E66.9

## 2016-04-08 MED ORDER — HYDROCHLOROTHIAZIDE 25 MG PO TABS: 25.0000 mg | ORAL_TABLET | Freq: Every day | ORAL | 11 refills | Status: DC

## 2016-04-08 MED ORDER — FLECAINIDE ACETATE 100 MG PO TABS: 100.0000 mg | ORAL_TABLET | Freq: Two times a day (BID) | ORAL | 11 refills | Status: AC

## 2016-04-08 MED ORDER — BENAZEPRIL HCL 10 MG PO TABS: 10.0000 mg | ORAL_TABLET | Freq: Every day | ORAL | 11 refills | Status: DC

## 2016-04-08 NOTE — Progress Notes (Signed)
Cardiology Office Note    Date:  04/08/2016   ID:  MOUHAMED MERTENS, DOB 1946-03-16, MRN [id number]  PCP:  [name]., MD  Cardiologist:  [name], MD   Chief Complaint  Patient presents with  . Atrial Fibrillation  . Hypertension    History of Present Illness:  Corey Hood is a 69 y.o. male with PAF s/p TEE/DCCV.  He presents today for followup. He is doing well. He denies any chest pain, SOB, DOE, LE edema, dizziness, palpitations or syncope.     Past Medical History:  Diagnosis Date  . Arthritis of both knees   . BPH (benign prostatic hyperplasia)   . Hypertension   . Obesity (BMI 30-39.9) 04/08/2016  . PAF (paroxysmal atrial fibrillation) (HCC)    s/p TEE/DCCV    Past Surgical History:  Procedure Laterality Date  . CARDIOVERSION N/A 12/09/2013   Procedure: CARDIOVERSION;  Surgeon: Larey Dresser, MD;  Location: [facility] ENDOSCOPY;  Service: Cardiovascular;  Laterality: N/A;  . CARDIOVERSION N/A 05/14/2015   Procedure: CARDIOVERSION;  Surgeon: Pixie Casino, MD;  Location: [place];  Service: Cardiovascular;  Laterality: N/A;  . CERVICAL SPINE SURGERY    . TEE WITHOUT CARDIOVERSION N/A 12/09/2013   Procedure: TRANSESOPHAGEAL ECHOCARDIOGRAM (TEE);  Surgeon: Larey Dresser, MD;  Location: [facility] ENDOSCOPY;  Service: Cardiovascular;  Laterality: N/A;  . TEE WITHOUT CARDIOVERSION N/A 05/14/2015   Procedure: TRANSESOPHAGEAL ECHOCARDIOGRAM (TEE);  Surgeon: Pixie Casino, MD;  Location: [facility] ENDOSCOPY;  Service: Cardiovascular;  Laterality: N/A;    Current Medications: Outpatient Medications Prior to Visit  Medication Sig Dispense Refill  . albuterol (PROAIR HFA) 108 (90 Base) MCG/ACT inhaler Inhale 2 puffs into the lungs every 4 (four) hours as needed. (wheezing)    . benazepril (LOTENSIN) 10 MG tablet take 1 tablet by mouth once daily 30 tablet 5  . calcium-vitamin D (OSCAL WITH D) 500-200 MG-UNIT per tablet Take 1 tablet by mouth daily with breakfast.    . Coenzyme  Q10 (COQ10) 100 MG CAPS Take 1 capsule by mouth daily.    Marland Kitchen diltiazem (TIAZAC) 240 MG 24 hr capsule Take 240 mg by mouth daily.    . finasteride (PROSCAR) 5 MG tablet Take 5 mg by mouth daily.  0  . flecainide (TAMBOCOR) 100 MG tablet take 1 tablet by mouth every 12 hours 60 tablet 0  . hydrochlorothiazide (HYDRODIURIL) 25 MG tablet take 1 tablet by mouth once daily 30 tablet 0  . KRILL OIL PO Take 1 capsule by mouth daily.    . Magnesium 250 MG TABS Take 1 tablet by mouth daily.    . meloxicam (MOBIC) 15 MG tablet Take 15 mg by mouth daily as needed for pain.    . Multiple Vitamin (MULTIVITAMIN WITH MINERALS) TABS tablet Take 1 tablet by mouth daily.    Marland Kitchen OVER THE COUNTER MEDICATION Take 1 capsule by mouth daily. Pt takes Livaplex by mouth daily    . Potassium 99 MG TABS Take 99 mg by mouth daily.    . rivaroxaban (XARELTO) 20 MG TABS tablet Take 1 tablet (20 mg total) by mouth daily with supper. 30 tablet 6  . Tragacanth (ASTRAGALUS ROOT) POWD Take 1 capsule by mouth daily.     No facility-administered medications prior to visit.      Allergies:   Codeine and Penicillins   Social History   Social History  . Marital status: Married    Spouse name: N/A  . Number of children:  N/A  . Years of education: N/A   Social History Main Topics  . Smoking status: Former Research scientist (life sciences)  . Smokeless tobacco: Never Used  . Alcohol use 16.8 oz/week    7 Glasses of wine, 21 Cans of beer per week  . Drug use: No  . Sexual activity: Not Asked   Other Topics Concern  . None   Social History Narrative  . None     Family History:  The patient's family history includes Cancer in his father and mother.   ROS:   Please see the history of present illness.    ROS All other systems reviewed and are negative.  No flowsheet data found.     PHYSICAL EXAM:   VS:  BP 140/86   Pulse 67   Ht 5\' 8"  (1.727 m)   Wt 215 lb 12.8 oz (97.9 kg)   BMI 32.81 kg/m    GEN: Well nourished, well developed, in no  acute distress  HEENT: normal  Neck: no JVD, carotid bruits, or masses Cardiac: RRR; no murmurs, rubs, or gallops,no edema.  Intact distal pulses bilaterally.  Respiratory:  clear to auscultation bilaterally, normal work of breathing GI: soft, nontender, nondistended, + BS MS: no deformity or atrophy  Skin: warm and dry, no rash Neuro:  Alert and Oriented x 3, Strength and sensation are intact Psych: euthymic mood, full affect  Wt Readings from Last 3 Encounters:  04/08/16 215 lb 12.8 oz (97.9 kg)  10/11/15 209 lb 12.8 oz (95.2 kg)  05/22/15 213 lb 9.6 oz (96.9 kg)      Studies/Labs Reviewed:   EKG:  EKG is  ordered today.  The ekg ordered today demonstrates NSR with IRBBB and no ST changes  Recent Labs: 10/11/2015: BUN 17; Creat 0.85; Hemoglobin 14.9; Platelets 195; Potassium 4.2; Sodium 139   Lipid Panel No results found for: CHOL, TRIG, HDL, CHOLHDL, VLDL, LDLCALC, LDLDIRECT  Additional studies/ records that were reviewed today include:  none    ASSESSMENT:    1. PAF (paroxysmal atrial fibrillation) ([place])   2. Essential hypertension   3. Obesity (BMI 30-39.9)      PLAN:  In order of problems listed above:  1. PAF maintaining NSR.  Continue CCB/Flecainide and DOAC.  2. HTN - BP controlled on current meds. Continue ACE I/CCB/diuretic 3.  Obesity - I have encouraged him to get into a routine exercise program and cut back on carbs and portions. I am going to get a sleep study to rule out OSA given his PAF.  Medication Adjustments/Labs and Tests Ordered: Current medicines are reviewed at length with the patient today.  Concerns regarding medicines are outlined above.  Medication changes, Labs and Tests ordered today are listed in the Patient Instructions below.  There are no Patient Instructions on file for this visit.   Signed, [name], MD  04/08/2016 3:16 PM    [facility] [place], [place], [place]  [address] Phone: [phone]; Fax: [phone]

## 2016-04-08 NOTE — Patient Instructions (Signed)
Medication Instructions:  Your physician recommends that you continue on your current medications as directed. Please refer to the Current Medication list given to you today.   Labwork: None  Testing/Procedures: Your physician has recommended that you have a sleep study. This test records several body functions during sleep, including: brain activity, eye movement, oxygen and carbon dioxide blood levels, heart rate and rhythm, breathing rate and rhythm, the flow of air through your mouth and nose, snoring, body muscle movements, and chest and belly movement.   Follow-Up: Your physician wants you to follow-up in: 6 months with Dr. Radford Pax. You will receive a reminder letter in the mail two months in advance. If you don't receive a letter, please call our office to schedule the follow-up appointment.   Any Other Special Instructions Will Be Listed Below (If Applicable).     If you need a refill on your cardiac medications before your next appointment, please call your pharmacy.

## 2016-04-25 ENCOUNTER — Telehealth: Payer: Self-pay | Admitting: *Deleted

## 2016-04-25 NOTE — Telephone Encounter (Signed)
Prior Authorization started for sleep study  Pending clinical review with [facility]  REF # [id number]

## 2016-05-08 NOTE — Telephone Encounter (Signed)
Sleep Study Approved through 07/24/2016.  Auth # [id number]

## 2016-06-29 ENCOUNTER — Encounter (HOSPITAL_BASED_OUTPATIENT_CLINIC_OR_DEPARTMENT_OTHER): Payer: Medicare HMO

## 2016-07-06 ENCOUNTER — Emergency Department (HOSPITAL_COMMUNITY): Admission: EM | Admit: 2016-07-06 | Discharge: 2016-07-06 | Discharge status: Absent without leave | Payer: Medicare HMO

## 2016-10-07 ENCOUNTER — Ambulatory Visit (INDEPENDENT_AMBULATORY_CARE_PROVIDER_SITE_OTHER): Payer: Medicare HMO | Admitting: Cardiology

## 2016-10-07 ENCOUNTER — Encounter: Payer: Self-pay | Admitting: Cardiology

## 2016-10-07 VITALS — BP 138/78 | HR 74 | Ht 68.0 in | Wt 219.2 lb

## 2016-10-07 DIAGNOSIS — E669 Obesity, unspecified: Secondary | ICD-10-CM | POA: Diagnosis not present

## 2016-10-07 DIAGNOSIS — I10 Essential (primary) hypertension: Secondary | ICD-10-CM

## 2016-10-07 DIAGNOSIS — I481 Persistent atrial fibrillation: Secondary | ICD-10-CM | POA: Diagnosis not present

## 2016-10-07 DIAGNOSIS — I4819 Other persistent atrial fibrillation: Secondary | ICD-10-CM

## 2016-10-07 LAB — CBC WITH DIFFERENTIAL/PLATELET
BASOS: 0 %
Basophils Absolute: 0 10*3/uL (ref 0.0–0.2)
EOS (ABSOLUTE): 0.1 10*3/uL (ref 0.0–0.4)
Eos: 1 %
HEMOGLOBIN: 15.5 g/dL (ref 13.0–17.7)
Hematocrit: 42.8 % (ref 37.5–51.0)
IMMATURE GRANS (ABS): 0 10*3/uL (ref 0.0–0.1)
IMMATURE GRANULOCYTES: 1 %
LYMPHS: 21 %
Lymphocytes Absolute: 1.2 10*3/uL (ref 0.7–3.1)
MCH: 34.2 pg — ABNORMAL HIGH (ref 26.6–33.0)
MCHC: 36.2 g/dL — ABNORMAL HIGH (ref 31.5–35.7)
MCV: 95 fL (ref 79–97)
MONOCYTES: 12 %
Monocytes Absolute: 0.7 10*3/uL (ref 0.1–0.9)
NEUTROS ABS: 3.7 10*3/uL (ref 1.4–7.0)
NEUTROS PCT: 65 %
PLATELETS: 200 10*3/uL (ref 150–379)
RBC: 4.53 x10E6/uL (ref 4.14–5.80)
RDW: 12.9 % (ref 12.3–15.4)
WBC: 5.6 10*3/uL (ref 3.4–10.8)

## 2016-10-07 LAB — BASIC METABOLIC PANEL
BUN/Creatinine Ratio: 20 (ref 10–24)
BUN: 15 mg/dL (ref 8–27)
CALCIUM: 9 mg/dL (ref 8.6–10.2)
CO2: 23 mmol/L (ref 18–29)
CREATININE: 0.76 mg/dL (ref 0.76–1.27)
Chloride: 102 mmol/L (ref 96–106)
GFR, EST AFRICAN AMERICAN: 107 mL/min/{1.73_m2} (ref >59)
GFR, EST NON AFRICAN AMERICAN: 92 mL/min/{1.73_m2} (ref >59)
Glucose: 108 mg/dL — ABNORMAL HIGH (ref 65–99)
Potassium: 5.2 mmol/L (ref 3.5–5.2)
Sodium: 141 mmol/L (ref 134–144)

## 2016-10-07 MED ORDER — HYDROCHLOROTHIAZIDE 25 MG PO TABS
25.0000 mg | ORAL_TABLET | Freq: Every day | ORAL | 11 refills | Status: DC
Start: 2016-10-07 — End: 2018-05-26

## 2016-10-07 NOTE — Patient Instructions (Signed)
Medication Instructions:  1) RESTART HCTZ 25 mg daily  Labwork: TODAY: BMET, CBC  Testing/Procedures: Your physician has recommended that you have a sleep study. This test records several body functions during sleep, including: brain activity, eye movement, oxygen and carbon dioxide blood levels, heart rate and rhythm, breathing rate and rhythm, the flow of air through your mouth and nose, snoring, body muscle movements, and chest and belly movement.   Dr. Radford Pax recommends you wear a 24 hour BLOOD PRESSURE MONITOR.  Follow-Up: Your physician wants you to follow-up in: 6 months with Dr. Theodosia Blender assistant. You will receive a reminder letter in the mail two months in advance. If you don't receive a letter, please call our office to schedule the follow-up appointment.   Your physician wants you to follow-up in: 1 year with Dr. Radford Pax. You will receive a reminder letter in the mail two months in advance. If you don't receive a letter, please call our office to schedule the follow-up appointment.   Any Other Special Instructions Will Be Listed Below (If Applicable).     If you need a refill on your cardiac medications before your next appointment, please call your pharmacy.

## 2016-10-07 NOTE — Progress Notes (Signed)
Cardiology Office Note    Date:  10/07/2016   ID:  Corey Hood, DOB 1945/08/11, MRN [id number]  PCP:  [name]., MD  Cardiologist:  [name], MD   Chief Complaint  Patient presents with  . Atrial Fibrillation  . Hypertension    History of Present Illness:  Corey Hood is a 71 y.o. male with PAF s/p TEE/DCCV 04/2015 and HTN. At last OV, I ordered a sleep study which he did not follow through on.  He is here  today for followup and is doing well. He denies any chest pain, LE edema, dizziness, palpitations or syncope. He has some mild DOE when mowing the yard and walking up hills.  He is exercising at the gym 3 times weekly.  He does not think that he has had any reoccurrence of his PAF.  He is concerned that his BP has been running on the high side in the am.     Past Medical History:  Diagnosis Date  . Arthritis of both knees   . BPH (benign prostatic hyperplasia)   . Hypertension   . Obesity (BMI 30-39.9) 04/08/2016  . Persistent atrial fibrillation (HCC)    s/p TEE/DCCV.  He is on Xarelto for a CHADS2VASC score 2    Past Surgical History:  Procedure Laterality Date  . CARDIOVERSION N/A 12/09/2013   Procedure: CARDIOVERSION;  Surgeon: Larey Dresser, MD;  Location: [facility] ENDOSCOPY;  Service: Cardiovascular;  Laterality: N/A;  . CARDIOVERSION N/A 05/14/2015   Procedure: CARDIOVERSION;  Surgeon: Pixie Casino, MD;  Location: [place];  Service: Cardiovascular;  Laterality: N/A;  . CERVICAL SPINE SURGERY    . TEE WITHOUT CARDIOVERSION N/A 12/09/2013   Procedure: TRANSESOPHAGEAL ECHOCARDIOGRAM (TEE);  Surgeon: Larey Dresser, MD;  Location: [facility] ENDOSCOPY;  Service: Cardiovascular;  Laterality: N/A;  . TEE WITHOUT CARDIOVERSION N/A 05/14/2015   Procedure: TRANSESOPHAGEAL ECHOCARDIOGRAM (TEE);  Surgeon: Pixie Casino, MD;  Location: [facility] ENDOSCOPY;  Service: Cardiovascular;  Laterality: N/A;    Current Medications: Current Meds  Medication Sig  . benazepril  (LOTENSIN) 10 MG tablet Take 1 tablet (10 mg total) by mouth daily. (Patient taking differently: Take 20 mg by mouth daily. )  . calcium-vitamin D (OSCAL WITH D) 500-200 MG-UNIT per tablet Take 1 tablet by mouth daily with breakfast.  . Coenzyme Q10 (COQ10) 100 MG CAPS Take 1 capsule by mouth daily.  Marland Kitchen diltiazem (TIAZAC) 240 MG 24 hr capsule Take 240 mg by mouth daily.  . finasteride (PROSCAR) 5 MG tablet Take 5 mg by mouth daily.  . flecainide (TAMBOCOR) 100 MG tablet Take 1 tablet (100 mg total) by mouth every 12 (twelve) hours.  . hydrochlorothiazide (HYDRODIURIL) 25 MG tablet Take 1 tablet (25 mg total) by mouth daily.  Marland Kitchen KRILL OIL PO Take 1 capsule by mouth daily.  . Magnesium 250 MG TABS Take 1 tablet by mouth daily.  . Multiple Vitamin (MULTIVITAMIN WITH MINERALS) TABS tablet Take 1 tablet by mouth daily.  Marland Kitchen OVER THE COUNTER MEDICATION Take 1 capsule by mouth daily. Pt takes Livaplex by mouth daily  . Potassium 99 MG TABS Take 99 mg by mouth daily.  . rivaroxaban (XARELTO) 20 MG TABS tablet Take 1 tablet (20 mg total) by mouth daily with supper.  . Tragacanth (ASTRAGALUS ROOT) POWD Take 1 capsule by mouth daily.    Allergies:   Codeine and Penicillins   Social History   Social History  . Marital status: Married  Spouse name: N/A  . Number of children: N/A  . Years of education: N/A   Social History Main Topics  . Smoking status: Former Research scientist (life sciences)  . Smokeless tobacco: Never Used  . Alcohol use 16.8 oz/week    7 Glasses of wine, 21 Cans of beer per week  . Drug use: No  . Sexual activity: Not Asked   Other Topics Concern  . None   Social History Narrative  . None     Family History:  The patient's family history includes Cancer in his father and mother.   ROS:   Please see the history of present illness.    ROS All other systems reviewed and are negative.  No flowsheet data found.     PHYSICAL EXAM:   VS:  BP 138/78   Pulse 74   Ht 5\' 8"  (1.727 m)   Wt 219  lb 4 oz (99.5 kg)   SpO2 97%   BMI 33.34 kg/m    GEN: Well nourished, well developed, in no acute distress  HEENT: normal  Neck: no JVD, carotid bruits, or masses Cardiac: RRR; no murmurs, rubs, or gallops,no edema.  Intact distal pulses bilaterally.  Respiratory:  clear to auscultation bilaterally, normal work of breathing GI: soft, nontender, nondistended, + BS MS: no deformity or atrophy  Skin: warm and dry, no rash Neuro:  Alert and Oriented x 3, Strength and sensation are intact Psych: euthymic mood, full affect  Wt Readings from Last 3 Encounters:  10/07/16 219 lb 4 oz (99.5 kg)  04/08/16 215 lb 12.8 oz (97.9 kg)  10/11/15 209 lb 12.8 oz (95.2 kg)      Studies/Labs Reviewed:   EKG:  EKG is ordered today.  The ekg ordered today demonstrates NSR at 74bpm with IRBBB  Recent Labs: 10/11/2015: BUN 17; Creat 0.85; Hemoglobin 14.9; Platelets 195; Potassium 4.2; Sodium 139   Lipid Panel No results found for: CHOL, TRIG, HDL, CHOLHDL, VLDL, LDLCALC, LDLDIRECT  Additional studies/ records that were reviewed today include:  none    ASSESSMENT:    1. Persistent atrial fibrillation ([place])   2. Essential hypertension   3. Obesity (BMI 30-39.9)      PLAN:  In order of problems listed above:  1. Persistent atrial fibrillation s/p remote TEE/DCCV.  He has not had any further reoccurrence of his afib. He will continue on Xarelto/Flecainide and Cardizem.  I will check a BMET and CBC today.  He has not followed through with his sleep study due to some issues with a hernia and is ready to proceed with the study.  2. HTN - BP controlled on current meds today but he says that his BP is elevated in the morning as high as 161'W systolic.  He will continue on ACE I/CCB.  He had been off the diuretic during the hernia surgery and I encouraged him to restart it.   I will get a 24 hour BP monitor.  I encouraged him to try to follow a 2gm sodium diet.  I have encouraged him to proceed with the  sleep study.  3. Obesity - I have encouraged him to get into a routine exercise program and cut back on carbs and portions.     Medication Adjustments/Labs and Tests Ordered: Current medicines are reviewed at length with the patient today.  Concerns regarding medicines are outlined above.  Medication changes, Labs and Tests ordered today are listed in the Patient Instructions below.  There are no Patient Instructions on file  for this visit.   Signed, [name], MD  10/07/2016 11:14 AM    [facility] [place], [place], [place]  [address] Phone: [phone]; Fax: [phone]

## 2016-10-08 ENCOUNTER — Telehealth: Payer: Self-pay

## 2016-10-08 DIAGNOSIS — I10 Essential (primary) hypertension: Secondary | ICD-10-CM

## 2016-10-08 NOTE — Telephone Encounter (Signed)
-----   Message from Sueanne Margarita, MD sent at 10/08/2016  9:54 AM EDT ----- Please have patient stop potassium and repeat BMET in 1 week

## 2016-10-08 NOTE — Telephone Encounter (Signed)
Informed patient of results and verbal understanding expressed.   Instructed patient to STOP K supplement. He will come in on 4/3 for BMET. He agrees with treatment plan.

## 2016-10-13 ENCOUNTER — Telehealth: Payer: Self-pay | Admitting: *Deleted

## 2016-10-13 ENCOUNTER — Telehealth: Payer: Self-pay | Admitting: Cardiology
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
## 2016-10-13 NOTE — Telephone Encounter (Signed)
notified pt. of sleep study. Spoke to his wife per DPR    By Freada Bergeron, [place]

## 2016-10-13 NOTE — Telephone Encounter (Signed)
10/13/2016 16:05  Pt returned call, he states he did have bruising and discomfort after previous phlebotomy and he "hates" needles. Pt verbalized his "fear" of needles, states he thinks he will be alright since he stopped the potassium supplement.  Pt has read information regarding potassium, he thinks he is doing ok, he doesn't need more lab work done at this time.  Pt discussed ways of getting potassium nutritionally. Pt was very pleasant, honest about his fear of needles. He does not see the need for additional lab work. Staff message sent to Dr Radford Pax. Georgana Curio MHA RN CCM

## 2016-10-14 ENCOUNTER — Other Ambulatory Visit: Payer: Medicare HMO

## 2016-10-14 NOTE — Telephone Encounter (Signed)
Please let patient know that I would still like to repeat BMET to make sur that high K has resolved

## 2016-10-17 NOTE — Telephone Encounter (Signed)
Patient agrees to have labs drawn on 4/10, but only if Corey Hood draws his labs. He understands he will be called on Monday IF Corey Hood will not be in the lab on Tuesday. He was grateful for call.

## 2016-10-20 NOTE — Telephone Encounter (Signed)
Informed patient that Lanny Hurst was out of the office today and may be out tomorrow. Patient requests a call tomorrow morning around 0900 to tell him whether Lanny Hurst is in the office. If he is working, the patient will come for lab work. If not, he will reschedule to next week. Patient was grateful for call.

## 2016-10-21 ENCOUNTER — Other Ambulatory Visit (INDEPENDENT_AMBULATORY_CARE_PROVIDER_SITE_OTHER): Payer: Medicare HMO | Admitting: *Deleted

## 2016-10-21 DIAGNOSIS — I10 Essential (primary) hypertension: Secondary | ICD-10-CM | POA: Diagnosis not present

## 2016-10-21 NOTE — Telephone Encounter (Signed)
Spoke with Corey Hood this AM and confirmed with patient Corey Hood will be in the lab today. Patient will come today for blood work. He was grateful for call.

## 2016-10-22 LAB — BASIC METABOLIC PANEL
BUN / CREAT RATIO: 28 — AB (ref 10–24)
BUN: 23 mg/dL (ref 8–27)
CHLORIDE: 100 mmol/L (ref 96–106)
CO2: 26 mmol/L (ref 18–29)
Calcium: 9.4 mg/dL (ref 8.6–10.2)
Creatinine, Ser: 0.83 mg/dL (ref 0.76–1.27)
GFR calc Af Amer: 103 mL/min/{1.73_m2} (ref >59)
GFR calc non Af Amer: 89 mL/min/{1.73_m2} (ref >59)
GLUCOSE: 110 mg/dL — AB (ref 65–99)
POTASSIUM: 4.5 mmol/L (ref 3.5–5.2)
Sodium: 141 mmol/L (ref 134–144)

## 2016-11-11 ENCOUNTER — Encounter: Payer: Self-pay | Admitting: *Deleted

## 2016-11-11 ENCOUNTER — Ambulatory Visit (INDEPENDENT_AMBULATORY_CARE_PROVIDER_SITE_OTHER): Payer: Medicare HMO

## 2016-11-11 DIAGNOSIS — I10 Essential (primary) hypertension: Secondary | ICD-10-CM

## 2016-11-11 NOTE — Progress Notes (Signed)
Patient ID: Corey Hood, male   DOB: 08-09-1945, 71 y.o.   MRN: [id number] 24 hour ambulatory blood pressure monitor applied to patient using regular adult cuff.

## 2016-11-13 ENCOUNTER — Telehealth: Payer: Self-pay | Admitting: *Deleted

## 2016-11-13 NOTE — Telephone Encounter (Signed)
Call came in today from [place] at [facility] stating she wanted to change the date of service for the patient. The new date of service is  12/01/16- 12/31/16. Those dates are valid for 30 days only. Authorization # [id number], ICD 10 CODE- 35670 (baseline PSG)

## 2016-11-14 ENCOUNTER — Other Ambulatory Visit (HOSPITAL_COMMUNITY): Payer: Self-pay | Admitting: Urology

## 2016-11-14 DIAGNOSIS — C61 Malignant neoplasm of prostate: Secondary | ICD-10-CM

## 2016-11-17 ENCOUNTER — Telehealth: Payer: Self-pay

## 2016-11-17 NOTE — Telephone Encounter (Signed)
Called patient to review BP report results. Average BP = 139/69 Informed patient of results and verbal understanding expressed.    While on the phone, patient reports the [place] increased his Benazepril to 40 mg daily.  Med list updated.

## 2016-11-21 ENCOUNTER — Telehealth: Payer: Self-pay | Admitting: Cardiology

## 2016-11-24 ENCOUNTER — Ambulatory Visit (HOSPITAL_COMMUNITY)
Admission: RE | Admit: 2016-11-24 | Discharge: 2016-11-24 | Disposition: A | Discharge status: Home / Self Care | Payer: Medicare HMO | Source: Ambulatory Visit | Attending: Urology | Admitting: Urology

## 2016-11-24 NOTE — Telephone Encounter (Addendum)
Late entry:   Called [employer] spoke to [place] at [facility]) [phone]) Says the patient was supposed to have a 95810 sleep study done 04/25/16 but it was cancelled. Split night [phone]) ordered for 12/01/16 . [employer] thought it was the same sleep study so they moved the 95810 sleep study to 12/01/16. Lorrie says if the 95810 was never done they will resubmitt the split night 95811. Today 11/24/16: Case # [id number].Hulen Skains [employer] spoke to [place] at [facility] [phone]) she states the patients sleep study [phone]) scheduled for 04/25/2016 was extended to 12/01/2016 was incorrect and has been cancelled. Spoke to clinical nurse Raford Pitcher) at [phone] to resubmit sleep study stated the patient needs a split night study 95811. Barb states patient but does not meet criteria for in lab study but qualifies for a in home study per [facility] [phone]). To Dr Radford Pax for approval for for HST.

## 2016-11-24 NOTE — Telephone Encounter (Signed)
New message      Pt has a sleep study scheduled for 1 week from today.  Can he get a presc for  something to help him sleep?

## 2016-11-25 NOTE — Telephone Encounter (Signed)
Ok for [employer]

## 2016-11-25 NOTE — Telephone Encounter (Signed)
ESS score=9 Patient has no sx required for in lab study. Per Dr Radford Pax ok for home sleep study.

## 2016-11-25 NOTE — Telephone Encounter (Signed)
Patient's in lab sleep study scheduled for 12/01/2016 has been denied by insurance and home sleep study approved. Patient has been notified of in home sleep study approval and in lab study has been cancelled. Notice of in lab sleep study denial sent to Dr Radford Pax and awaiting approval of home sleep study.

## 2016-11-28 ENCOUNTER — Ambulatory Visit (HOSPITAL_COMMUNITY): Payer: Medicare HMO

## 2016-11-28 NOTE — Telephone Encounter (Signed)
Home Sleep Study ordered 11/28/2016 through [place].

## 2016-12-01 ENCOUNTER — Encounter (HOSPITAL_BASED_OUTPATIENT_CLINIC_OR_DEPARTMENT_OTHER): Payer: Medicare HMO

## 2016-12-04 ENCOUNTER — Ambulatory Visit (HOSPITAL_COMMUNITY)
Admission: RE | Admit: 2016-12-04 | Discharge: 2016-12-04 | Disposition: A | Discharge status: Home / Self Care | Payer: Medicare HMO | Source: Ambulatory Visit | Attending: Urology | Admitting: Urology

## 2016-12-04 DIAGNOSIS — N4289 Other specified disorders of prostate: Secondary | ICD-10-CM | POA: Diagnosis not present

## 2016-12-04 DIAGNOSIS — C61 Malignant neoplasm of prostate: Secondary | ICD-10-CM | POA: Insufficient documentation

## 2016-12-04 MED ORDER — GADOBENATE DIMEGLUMINE 529 MG/ML IV SOLN
20.0000 mL | Freq: Once | INTRAVENOUS | Status: AC | PRN
  Administered 2016-12-04: 20 mL via INTRAVENOUS

## 2016-12-12 ENCOUNTER — Encounter: Payer: Self-pay | Admitting: Cardiology

## 2016-12-16 NOTE — Telephone Encounter (Signed)
Late entry: Home Sleep study received 12/14/16.

## 2016-12-18 ENCOUNTER — Telehealth: Payer: Self-pay | Admitting: *Deleted

## 2016-12-18 DIAGNOSIS — G4733 Obstructive sleep apnea (adult) (pediatric): Secondary | ICD-10-CM

## 2016-12-18 NOTE — Telephone Encounter (Signed)
-----   Message from Sueanne Margarita, MD sent at 12/15/2016  3:22 PM EDT ----- Please let patient know that he has moderate OSA with AHI 20/hr and significant oxygen desaturations.  Please set up in lab CPAP titration.

## 2016-12-18 NOTE — Telephone Encounter (Signed)
Informed patient of sleep study results and patient understanding was verbalized. Patient understands his oxygen drops significantly. Patient understands Dr Radford Pax recommended a CPAP titration.

## 2016-12-19 ENCOUNTER — Encounter: Payer: Self-pay | Admitting: *Deleted

## 2016-12-19 NOTE — Telephone Encounter (Signed)
Informed patient of upcoming Titration study and patient understanding was verbalized. Patient understands his TITRATION is scheduled for Wednesday February 18 2017. Patient understands his Titration study will be done at [facility] sleep lab. Patient understands he will receive a sleep letter in a week or so. Patient understands to call if he does not receive the sleep letter in a timely manner. Patient agrees with treatment and thanked me for call

## 2017-01-26 ENCOUNTER — Telehealth: Payer: Self-pay | Admitting: Cardiology

## 2017-01-26 NOTE — Telephone Encounter (Signed)
Patient called to report 5 nose bleeds in the last couple months, 2 being yesterday. One occurred when sneezing, another occurred when he was just sitting in the chair. No trauma has occurred to his nose. He states his allergies aren't bad this year and he does not have a cold. He held is Xarelto yesterday because he did not want his nose to bleed anymore. Reiterated to patient the importance of taking Xarelto daily. Instructed him to see PCP for evaluation of other causes of nose bleeds prior to stopping the medication. He has no other signs of bleeding.  He also states he decreased his Benazepril to 20 mg daily due to dry cough.  He understands he will be called if Dr. Radford Pax has further recommendations.

## 2017-01-26 NOTE — Telephone Encounter (Signed)
Please refer to ENT ASAP also do we fill his ACE I or does PCP

## 2017-01-26 NOTE — Telephone Encounter (Signed)
New message   Please call around 230pm when he will be back home  Since last visit pt has had 5 events of nose bleeds, he is concerned with this   Pt c/o medication issue:  1. Name of Medication:  Xarelto  2. How are you currently taking this medication (dosage and times per day)?  Every day 1x a day  3. Are you having a reaction (difficulty breathing--STAT)?  no  4. What is your medication issue?  Nose bleeds   Pt c/o medication issue:  1. Name of Medication:  benazepril (LOTENSIN) 40 MG tablet Take 40 mg by mouth daily.     2. How are you currently taking this medication (dosage and times per day)? Only taking 20 mg   3. Are you having a reaction (difficulty breathing--STAT)?  no  4. What is your medication issue?  A dry cough when he was taking the 40mg  , so he cut it back to 20mg 

## 2017-01-27 NOTE — Telephone Encounter (Signed)
Left message to call back  

## 2017-01-28 ENCOUNTER — Telehealth: Payer: Self-pay | Admitting: Cardiology

## 2017-01-28 NOTE — Telephone Encounter (Signed)
Received fax from Dr. Ron Parker requesting an Oral appliance therapy sheet filled out and faxed back. This was addressed to Dr. Radford Pax, so faxing this back to Dr. Theodosia Blender office to get filled out.

## 2017-02-05 NOTE — Telephone Encounter (Signed)
Called to follow-up with patient. He does not wish for ENT referral at this time as he has had no further bleeding. He states he will call PCP for referral if bleeding reoccurs as it is better for his insurance. He was grateful for call.

## 2017-02-18 ENCOUNTER — Ambulatory Visit (HOSPITAL_BASED_OUTPATIENT_CLINIC_OR_DEPARTMENT_OTHER): Payer: Medicare HMO | Attending: Cardiology | Admitting: Cardiology

## 2017-02-18 VITALS — Ht 68.0 in | Wt 210.0 lb

## 2017-02-18 DIAGNOSIS — I491 Atrial premature depolarization: Secondary | ICD-10-CM | POA: Insufficient documentation

## 2017-02-18 DIAGNOSIS — R0683 Snoring: Secondary | ICD-10-CM | POA: Insufficient documentation

## 2017-02-18 DIAGNOSIS — I471 Supraventricular tachycardia: Secondary | ICD-10-CM | POA: Insufficient documentation

## 2017-02-18 DIAGNOSIS — G4733 Obstructive sleep apnea (adult) (pediatric): Secondary | ICD-10-CM

## 2017-02-18 DIAGNOSIS — G4730 Sleep apnea, unspecified: Secondary | ICD-10-CM | POA: Diagnosis present

## 2017-02-19 ENCOUNTER — Other Ambulatory Visit (HOSPITAL_BASED_OUTPATIENT_CLINIC_OR_DEPARTMENT_OTHER): Payer: Self-pay

## 2017-02-19 DIAGNOSIS — G4733 Obstructive sleep apnea (adult) (pediatric): Secondary | ICD-10-CM

## 2017-02-21 NOTE — Procedures (Signed)
   Patient Name: Corey Hood, Corey Hood Date: 02/18/2017 Gender: Male D.O.B: Oct 27, 1945 Age (years): 60 Referring Provider: [name] MD, ABSM Height (inches): 68 Interpreting Physician: [name] MD, ABSM Weight (lbs): 210 RPSGT: Carolin Coy BMI: 32 MRN: [id number] Neck Size: 16.50  CLINICAL INFORMATION The patient is referred for a BiPAP titration to treat sleep apnea.  SLEEP STUDY TECHNIQUE As per the AASM Manual for the Scoring of Sleep and Associated Events v2.3 (April 2016) with a hypopnea requiring 4% desaturations.  The channels recorded and monitored were frontal, central and occipital EEG, electrooculogram (EOG), submentalis EMG (chin), nasal and oral airflow, thoracic and abdominal wall motion, anterior tibialis EMG, snore microphone, electrocardiogram, and pulse oximetry. Bilevel positive airway pressure (BPAP) was initiated at the beginning of the study and titrated to treat sleep-disordered breathing.  MEDICATIONS Medications self-administered by patient taken the night of the study : FLECAINIDE ACETATE  RESPIRATORY PARAMETERS Optimal IPAP Pressure (cm): N/A  AHI at Optimal Pressure (/hr) N/A Optimal EPAP Pressure (cm):N/A     Overall Minimal O2 (%):86.00  Minimal O2 at Optimal Pressure (%): N/A  SLEEP ARCHITECTURE Start Time:11:04:07 PM  Stop Time:5:28:51 AM  Total Time (min):384.7  Total Sleep Time (min):168.5 Sleep Latency (min):16.7  Sleep Efficiency (%):43.8  REM Latency (min):116.5  WASO (min): 199.5 Stage N1 (%): 23.74  Stage N2 (%): 58.75  Stage N3 (%): 0.00  Stage R (%): 17.51 Supine (%): 45.99 Arousal Index (/hr): 34.2     CARDIAC DATA The 2 lead EKG demonstrated sinus rhythm. The mean heart rate was 59.31 beats per minute. Other EKG findings include: PACs and nonsustained atrial tachycardia.  LEG MOVEMENT DATA The total Periodic Limb Movements of Sleep (PLMS) were 0. The PLMS index was 0.00. A PLMS index of <15 is considered normal in  adults.  IMPRESSIONS - An optimal PAP pressure could not be selected for this patient due to ongoing respiratory events.   - Central sleep apnea was not noted during this titration (CAI = 0.7/h). - Severe oxygen desaturations were observed during this titration (min O2 = 86.00%). - The patient snored with Soft snoring volume. - PACs and nonsustained atrial tachycardia were observed during this study. - Clinically significant periodic limb movements were not noted during this study. Arousals associated with PLMs were rare.  DIAGNOSIS - Obstructive Sleep Apnea (327.23 [G47.33 ICD-10]) - Nonsustained atrial tachycardia - PACs  RECOMMENDATIONS - Recommend full night BiPAP titration - Avoid alcohol, sedatives and other CNS depressants that may worsen sleep apnea and disrupt normal sleep architecture. - Sleep hygiene should be reviewed to assess factors that may improve sleep quality. - Weight management and regular exercise should be initiated or continued.  [place], American Board of Sleep Medicine  ELECTRONICALLY SIGNED ON:  02/21/2017, 7:29 PM [place] PH: [phone]   FX: [phone] [place]

## 2017-02-26 ENCOUNTER — Telehealth: Payer: Self-pay | Admitting: *Deleted

## 2017-02-26 NOTE — Telephone Encounter (Signed)
Return: Informed patient of sleep study results and patient understanding was verbalized. Patient understands he had an unsuccessful CPAP titration and that Dr Radford Pax has recommended an in lab  BIPAP titratrion. Patient states he would like to wait to see what the bill for CPAP titration is before moving forward with the BIPAP titration. Patient was grateful and thanked me for the call.

## 2017-02-26 NOTE — Telephone Encounter (Signed)
LMTCB

## 2017-02-26 NOTE — Telephone Encounter (Signed)
-----   Message from Sueanne Margarita, MD sent at 02/21/2017  7:32 PM EDT ----- Please let patient know that they had a unsuccessful CPAP titration and set up for in lab BiPAP titration

## 2017-03-30 ENCOUNTER — Encounter: Payer: Self-pay | Admitting: Cardiology

## 2017-03-30 DIAGNOSIS — G4733 Obstructive sleep apnea (adult) (pediatric): Secondary | ICD-10-CM

## 2017-03-30 HISTORY — DX: Obstructive sleep apnea (adult) (pediatric): G47.33

## 2017-03-30 NOTE — Progress Notes (Signed)
Cardiology Office Note:    Date:  03/31/2017   ID:  Dorise Bullion, DOB [birth year], MRN [id number]  PCP:  [name]., MD  Cardiologist:  [name], MD   Referring MD: [name]., MD   Chief Complaint  Patient presents with  . Atrial Fibrillation  . Sleep Apnea  . Hypertension    History of Present Illness:    Corey Hood is a 71 y.o. male with a hx of persistent AF s/p TEE/DCCV 04/2015, HTN and moderate OSA with AHI 21.3/hr with nocturnal hypoxemia and oxygen desaturations as low as 70%.  He underwent CPAP titration but due to ongoing respiratory events could not be titrated on CPAP and a full night BIPAP titration was ordered but he has not followed through yet because he was concerned about cost.    He is here today for followup and is doing well.  He denies any chest pain or pressure, SOB, PND, orthopnea, LE edema, dizziness, palpitations or syncope.  His main concern is that he has had a few nose bleeds recently.  He says in total there were 6 events.  He has chronic DOE when he mows the yard that has no changed.   Past Medical History:  Diagnosis Date  . Arthritis of both knees   . BPH (benign prostatic hyperplasia)   . Hypertension   . Obesity (BMI 30-39.9) 04/08/2016  . OSA (obstructive sleep apnea) 03/30/2017   Moderate with AHI 21.3/hr with nocturnal hypoxemia by sleep study  . Persistent atrial fibrillation (HCC)    s/p TEE/DCCV.  He is on Xarelto for a CHADS2VASC score 2    Past Surgical History:  Procedure Laterality Date  . CARDIOVERSION N/A 12/09/2013   Procedure: CARDIOVERSION;  Surgeon: Larey Dresser, MD;  Location: [facility] ENDOSCOPY;  Service: Cardiovascular;  Laterality: N/A;  . CARDIOVERSION N/A 05/14/2015   Procedure: CARDIOVERSION;  Surgeon: Pixie Casino, MD;  Location: [place];  Service: Cardiovascular;  Laterality: N/A;  . CERVICAL SPINE SURGERY    . TEE WITHOUT CARDIOVERSION N/A 12/09/2013   Procedure: TRANSESOPHAGEAL ECHOCARDIOGRAM (TEE);   Surgeon: Larey Dresser, MD;  Location[facility] ENDOSCOPY;  Service: Cardiovascular;  Laterality: N/A;  . TEE WITHOUT CARDIOVERSION N/A 05/14/2015   Procedure: TRANSESOPHAGEAL ECHOCARDIOGRAM (TEE);  Surgeon: Pixie Casino, MD;  Location: [facility] ENDOSCOPY;  Service: Cardiovascular;  Laterality: N/A;    Current Medications: Current Meds  Medication Sig  . albuterol (PROAIR HFA) 108 (90 Base) MCG/ACT inhaler Inhale 2 puffs into the lungs every 4 (four) hours as needed. (wheezing)  . benazepril (LOTENSIN) 40 MG tablet Take 20 mg by mouth daily.   . calcium-vitamin D (OSCAL WITH D) 500-200 MG-UNIT per tablet Take 1 tablet by mouth daily with breakfast.  . Coenzyme Q10 (COQ10) 100 MG CAPS Take 1 capsule by mouth daily.  Marland Kitchen diltiazem (TIAZAC) 240 MG 24 hr capsule Take 240 mg by mouth daily.  . finasteride (PROSCAR) 5 MG tablet Take 5 mg by mouth daily.  . flecainide (TAMBOCOR) 100 MG tablet Take 1 tablet (100 mg total) by mouth every 12 (twelve) hours.  . hydrochlorothiazide (HYDRODIURIL) 25 MG tablet Take 1 tablet (25 mg total) by mouth daily.  Marland Kitchen KRILL OIL PO Take 1 capsule by mouth daily.  . Magnesium 250 MG TABS Take 1 tablet by mouth daily.  . Multiple Vitamin (MULTIVITAMIN WITH MINERALS) TABS tablet Take 1 tablet by mouth daily.  Marland Kitchen OVER THE COUNTER MEDICATION Take 1 capsule by mouth daily.  Pt takes Livaplex by mouth daily  . rivaroxaban (XARELTO) 20 MG TABS tablet Take 1 tablet (20 mg total) by mouth daily with supper.  . Tragacanth (ASTRAGALUS ROOT) POWD Take 1 capsule by mouth daily.     Allergies:   Benazepril; Codeine; and Penicillins   Social History   Social History  . Marital status: Married    Spouse name: N/A  . Number of children: N/A  . Years of education: N/A   Social History Main Topics  . Smoking status: Former Research scientist (life sciences)  . Smokeless tobacco: Never Used  . Alcohol use 16.8 oz/week    7 Glasses of wine, 21 Cans of beer per week  . Drug use: No  . Sexual activity: Not Asked    Other Topics Concern  . None   Social History Narrative  . None     Family History: The patient's family history includes Cancer in his father and mother.  ROS:   Please see the history of present illness.     All other systems reviewed and are negative.  EKGs/Labs/Other Studies Reviewed:    The following studies were reviewed today: CPAP titration and home sleep study  EKG:  EKG is not ordered today.    Recent Labs: 10/07/2016: Hemoglobin 15.5; Platelets 200 10/21/2016: BUN 23; Creatinine, Ser 0.83; Potassium 4.5; Sodium 141   Recent Lipid Panel No results found for: CHOL, TRIG, HDL, CHOLHDL, VLDL, LDLCALC, LDLDIRECT  Physical Exam:    VS:  BP (!) 168/72   Pulse 84   Ht 5\' 8"  (1.727 m)   Wt 212 lb 12.8 oz (96.5 kg)   BMI 32.36 kg/m     Wt Readings from Last 3 Encounters:  03/31/17 212 lb 12.8 oz (96.5 kg)  02/18/17 210 lb (95.3 kg)  10/07/16 219 lb 4 oz (99.5 kg)     GEN:  Well nourished, well developed in no acute distress HEENT: Normal NECK: No JVD; No carotid bruits LYMPHATICS: No lymphadenopathy CARDIAC: RRR, no murmurs, rubs, gallops RESPIRATORY:  Clear to auscultation without rales, wheezing or rhonchi  ABDOMEN: Soft, non-tender, non-distended MUSCULOSKELETAL:  No edema; No deformity  SKIN: Warm and dry NEUROLOGIC:  Alert and oriented x 3 PSYCHIATRIC:  Normal affect   ASSESSMENT:    1. Persistent atrial fibrillation ([place])   2. Essential hypertension   3. OSA (obstructive sleep apnea)   4. Obesity (BMI 30-39.9)    PLAN:    In order of problems listed above:  1. Persistent atrial fibrillation - he is maintaining NSR.  He will continue on Flecainide 100mg  BID, Diltiazem 240mg  daily and Xarelto 20mg  daily.  I will check a BMET and CBC today.  He denies any bleeding problems and is compliant with his meds with no side effects.  2. HTN - BP is elevated on exam today.  At home his BP is running high as well.  He cut his benazepril in half due to a  cough.  I will stop his benazepril and start Losartan 100mg  daily.  He will continue on Diltiazem 240mg  daily and HCTZ 25mg  daily.    3. OSA - moderate by PSG.  He failed CPAP titration due to ongoing respiratory events and has not followed up for BIPAP titration.  He is willing to proceed now so I will set him up.    4. Obesity - I have encouraged him to get into a routine exercise program and cut back on carbs and portions. Hi exercise is limited by hip pain.  Medication Adjustments/Labs and Tests Ordered: Current medicines are reviewed at length with the patient today.  Concerns regarding medicines are outlined above.  No orders of the defined types were placed in this encounter.  No orders of the defined types were placed in this encounter.   Signed, [name], MD  03/31/2017 10:21 AM    [place]

## 2017-03-31 ENCOUNTER — Other Ambulatory Visit: Payer: Self-pay | Admitting: Cardiology

## 2017-03-31 ENCOUNTER — Encounter: Payer: Self-pay | Admitting: Cardiology

## 2017-03-31 ENCOUNTER — Ambulatory Visit (INDEPENDENT_AMBULATORY_CARE_PROVIDER_SITE_OTHER): Payer: Medicare HMO | Admitting: Cardiology

## 2017-03-31 VITALS — BP 168/72 | HR 84 | Ht 68.0 in | Wt 212.8 lb

## 2017-03-31 DIAGNOSIS — I481 Persistent atrial fibrillation: Secondary | ICD-10-CM

## 2017-03-31 DIAGNOSIS — I10 Essential (primary) hypertension: Secondary | ICD-10-CM

## 2017-03-31 DIAGNOSIS — E669 Obesity, unspecified: Secondary | ICD-10-CM | POA: Diagnosis not present

## 2017-03-31 DIAGNOSIS — I4819 Other persistent atrial fibrillation: Secondary | ICD-10-CM

## 2017-03-31 DIAGNOSIS — G4733 Obstructive sleep apnea (adult) (pediatric): Secondary | ICD-10-CM | POA: Diagnosis not present

## 2017-03-31 MED ORDER — LOSARTAN POTASSIUM 100 MG PO TABS: 100.0000 mg | ORAL_TABLET | Freq: Every day | ORAL | 6 refills | Status: DC

## 2017-03-31 NOTE — Addendum Note (Signed)
Addended by: Marciano Sequin on: 03/31/2017 10:57 AM   Modules accepted: Orders

## 2017-03-31 NOTE — Patient Instructions (Signed)
Stop Benazepril    Start Losartan 100 mg daily    Lab work today ( bmet,cbc )     Your physician wants you to follow-up in: 6 months. You will receive a reminder letter in the mail two months in advance. If you don't receive a letter, please call our office to schedule the follow-up appointment.

## 2017-03-31 NOTE — Addendum Note (Signed)
Addended by: Harland German A on: 03/31/2017 04:26 PM   Modules accepted: Orders

## 2017-04-01 LAB — CBC WITH DIFFERENTIAL/PLATELET
BASOS: 0 %
Basophils Absolute: 0 10*3/uL (ref 0.0–0.2)
EOS (ABSOLUTE): 0.1 10*3/uL (ref 0.0–0.4)
EOS: 1 %
HEMATOCRIT: 45.6 % (ref 37.5–51.0)
Hemoglobin: 15.7 g/dL (ref 13.0–17.7)
Immature Grans (Abs): 0 10*3/uL (ref 0.0–0.1)
Immature Granulocytes: 0 %
LYMPHS ABS: 1.3 10*3/uL (ref 0.7–3.1)
Lymphs: 18 %
MCH: 33.3 pg — ABNORMAL HIGH (ref 26.6–33.0)
MCHC: 34.4 g/dL (ref 31.5–35.7)
MCV: 97 fL (ref 79–97)
MONOS ABS: 0.8 10*3/uL (ref 0.1–0.9)
Monocytes: 11 %
NEUTROS PCT: 70 %
Neutrophils Absolute: 5.1 10*3/uL (ref 1.4–7.0)
PLATELETS: 227 10*3/uL (ref 150–379)
RBC: 4.71 x10E6/uL (ref 4.14–5.80)
RDW: 12.5 % (ref 12.3–15.4)
WBC: 7.3 10*3/uL (ref 3.4–10.8)

## 2017-04-01 LAB — BASIC METABOLIC PANEL
BUN/Creatinine Ratio: 20 (ref 10–24)
BUN: 16 mg/dL (ref 8–27)
CHLORIDE: 100 mmol/L (ref 96–106)
CO2: 22 mmol/L (ref 20–29)
Calcium: 9.7 mg/dL (ref 8.6–10.2)
Creatinine, Ser: 0.8 mg/dL (ref 0.76–1.27)
GFR, EST AFRICAN AMERICAN: 104 mL/min/{1.73_m2} (ref >59)
GFR, EST NON AFRICAN AMERICAN: 90 mL/min/{1.73_m2} (ref >59)
Glucose: 140 mg/dL — ABNORMAL HIGH (ref 65–99)
POTASSIUM: 4.5 mmol/L (ref 3.5–5.2)
SODIUM: 139 mmol/L (ref 134–144)

## 2017-04-10 ENCOUNTER — Encounter: Payer: Self-pay | Admitting: *Deleted

## 2017-04-10 NOTE — Telephone Encounter (Addendum)
Informed patient of upcoming BiPAP Titration study and patient understanding was verbalized. Patient understands his Titration study is scheduled for May 24 2017. Patient understands his Titration study will be done at [facility] sleep lab. Patient understands he will receive a sleep packet in a week or so. Patient understands to call if he does not receive the sleep packet in a timely manner. Patient agrees with treatment and thanked me for call   Reached out to patient to see if he was ready to move forward with his Bipap Titration LMTCB.

## 2017-05-19 NOTE — Telephone Encounter (Addendum)
Patient has cancelled his sleep study on Friday 05/15/17/ because of having sciatic pain in his back.

## 2017-05-24 ENCOUNTER — Encounter (HOSPITAL_BASED_OUTPATIENT_CLINIC_OR_DEPARTMENT_OTHER): Payer: Medicare HMO

## 2017-06-08 ENCOUNTER — Telehealth: Payer: Self-pay | Admitting: Cardiology

## 2017-06-09 NOTE — Telephone Encounter (Signed)
Clearance faxed to Dr. Laurance Flatten at [phone]

## 2017-06-09 NOTE — Telephone Encounter (Signed)
   Chart reviewed as part of pre-operative protocol coverage. Given past medical history and time since last visit, based on ACC/AHA guidelines, Corey Hood would be at acceptable risk for the planned procedure without further cardiovascular testing.   According to the Revised Cardiac Risk Index (RCRI), perioperative risk of major cardiac events is <1% at 0.4.  He does not have any exertional CP or dyspnea. Able to complete 4 METs of activity (walk up stairs w/o exertional symptoms). Based on this he can be cleared for hip surgery   He is on chronic [place] for atrial fibrillation. Pharmacy to address a/c.   I will route this recommendation to the requesting party via Epic fax function and remove from pre-op pool.  Please call with questions.  Lyda Jester, PA-C 06/09/2017, 2:34 PM

## 2017-06-09 NOTE — Telephone Encounter (Signed)
New Message   Patient last seen 03/2017      [facility] Pre-operative Risk Assessment    Request for surgical clearance:  1. What type of surgery is being performed?  Hip replacement   2. When is this surgery scheduled? 06/30/17  3. Are there any medications that need to be held prior to surgery and how long?  Please advise on instructions for Xarelto   4. Practice name and name of physician performing surgery?  Dr Hart [place]   5. What is your office phone and fax number?  [phone] fax [phone]  6. Anesthesia type (None, local, MAC, general) ? Does not know yet - usually general    Corey Hood 06/09/2017, 9:12 AM  _________________________________________________________________   (provider comments below)

## 2017-06-09 NOTE — Telephone Encounter (Signed)
Patient with diagnosis of Afib on Xarelto for anticoagulation.    Procedure: Hip replacement Date of procedure: 07/10/17  CHADS2 score of 2 (CHF, HTN, AGE, DM2 - borderline, stroke/tia x 2);  CHADS2-VASc score of  3 (CHF, HTN, AGE, DM2 - borderline, stroke/tia x 2, CAD, AGE, male)  CrCl 157mL/min.   Per office protocol, patient can hold Xarelto for 3 days prior to procedure.

## 2017-10-21 ENCOUNTER — Ambulatory Visit: Payer: Medicare HMO | Admitting: Cardiology

## 2017-10-21 ENCOUNTER — Encounter: Payer: Self-pay | Admitting: Cardiology

## 2017-10-21 VITALS — BP 126/46 | HR 75 | Wt 201.8 lb

## 2017-10-21 DIAGNOSIS — G4733 Obstructive sleep apnea (adult) (pediatric): Secondary | ICD-10-CM

## 2017-10-21 DIAGNOSIS — I10 Essential (primary) hypertension: Secondary | ICD-10-CM | POA: Diagnosis not present

## 2017-10-21 DIAGNOSIS — I4819 Other persistent atrial fibrillation: Secondary | ICD-10-CM

## 2017-10-21 DIAGNOSIS — I481 Persistent atrial fibrillation: Secondary | ICD-10-CM

## 2017-10-21 MED ORDER — LOSARTAN POTASSIUM 100 MG PO TABS: 100.0000 mg | ORAL_TABLET | Freq: Every day | ORAL | 11 refills | Status: DC

## 2017-10-21 NOTE — Progress Notes (Signed)
Cardiology Office Note:    Date:  10/21/2017   ID:  Corey Hood, DOB 14-May-1946, MRN [id number]  PCP:  [name]., MD  Cardiologist:  No primary care provider on file.    Referring MD: [name]., MD   Chief Complaint  Patient presents with  . Atrial Fibrillation  . Hypertension  . Sleep Apnea    History of Present Illness:    Corey Hood is a 72 y.o. male with a hx of persistent AF s/p TEE/DCCV 04/2015, HTN and moderate OSA with AHI 21.3/hr with nocturnal hypoxemia and oxygen desaturations as low as 70%.  He underwent CPAP titration but due to ongoing respiratory events could not be titrated on CPAP and was subsequently ordered a BiPAP titration but never followed through.  He is here today for followup and is doing well.  He denies any chest pain or pressure, SOB, DOE, PND, orthopnea, LE edema, dizziness, palpitations or syncope. He is compliant with his meds and is tolerating meds with no SE.        Past Medical History:  Diagnosis Date  . Arthritis of both knees   . BPH (benign prostatic hyperplasia)   . Hypertension   . Obesity (BMI 30-39.9) 04/08/2016  . OSA (obstructive sleep apnea) 03/30/2017   Moderate with AHI 21.3/hr with nocturnal hypoxemia by sleep study  . Persistent atrial fibrillation (HCC)    s/p TEE/DCCV.  He is on Xarelto for a CHADS2VASC score 2    Past Surgical History:  Procedure Laterality Date  . CARDIOVERSION N/A 12/09/2013   Procedure: CARDIOVERSION;  Surgeon: Larey Dresser, MD;  Location: [facility] ENDOSCOPY;  Service: Cardiovascular;  Laterality: N/A;  . CARDIOVERSION N/A 05/14/2015   Procedure: CARDIOVERSION;  Surgeon: Pixie Casino, MD;  Location: [place];  Service: Cardiovascular;  Laterality: N/A;  . CERVICAL SPINE SURGERY    . TEE WITHOUT CARDIOVERSION N/A 12/09/2013   Procedure: TRANSESOPHAGEAL ECHOCARDIOGRAM (TEE);  Surgeon: Larey Dresser, MD;  Location: [facility] ENDOSCOPY;  Service: Cardiovascular;  Laterality: N/A;  . TEE WITHOUT  CARDIOVERSION N/A 05/14/2015   Procedure: TRANSESOPHAGEAL ECHOCARDIOGRAM (TEE);  Surgeon: Pixie Casino, MD;  Location: [facility] ENDOSCOPY;  Service: Cardiovascular;  Laterality: N/A;    Current Medications: Current Meds  Medication Sig  . albuterol (PROAIR HFA) 108 (90 Base) MCG/ACT inhaler Inhale 2 puffs into the lungs every 4 (four) hours as needed. (wheezing)  . calcium-vitamin D (OSCAL WITH D) 500-200 MG-UNIT per tablet Take 1 tablet by mouth daily with breakfast.  . Coenzyme Q10 (COQ10) 100 MG CAPS Take 1 capsule by mouth daily.  Marland Kitchen diltiazem (TIAZAC) 240 MG 24 hr capsule Take 240 mg by mouth daily.  . finasteride (PROSCAR) 5 MG tablet Take 5 mg by mouth daily.  . flecainide (TAMBOCOR) 100 MG tablet Take 1 tablet (100 mg total) by mouth every 12 (twelve) hours.  . hydrochlorothiazide (HYDRODIURIL) 25 MG tablet Take 1 tablet (25 mg total) by mouth daily.  Marland Kitchen KRILL OIL PO Take 1 capsule by mouth daily.  Marland Kitchen losartan (COZAAR) 100 MG tablet Take 1 tablet (100 mg total) by mouth daily.  . Magnesium 250 MG TABS Take 1 tablet by mouth daily.  . Multiple Vitamin (MULTIVITAMIN WITH MINERALS) TABS tablet Take 1 tablet by mouth daily.  Marland Kitchen OVER THE COUNTER MEDICATION Take 1 capsule by mouth daily. Pt takes Livaplex by mouth daily  . Tragacanth (ASTRAGALUS ROOT) POWD Take 1 capsule by mouth daily.     Allergies:  Benazepril; Codeine; and Penicillins   Social History   Socioeconomic History  . Marital status: Married    Spouse name: Not on file  . Number of children: Not on file  . Years of education: Not on file  . Highest education level: Not on file  Occupational History  . Not on file  Social Needs  . Financial resource strain: Not on file  . Food insecurity:    Worry: Not on file    Inability: Not on file  . Transportation needs:    Medical: Not on file    Non-medical: Not on file  Tobacco Use  . Smoking status: Former Research scientist (life sciences)  . Smokeless tobacco: Never Used  Substance and Sexual  Activity  . Alcohol use: Yes    Alcohol/week: 16.8 oz    Types: 7 Glasses of wine, 21 Cans of beer per week  . Drug use: No  . Sexual activity: Not on file  Lifestyle  . Physical activity:    Days per week: Not on file    Minutes per session: Not on file  . Stress: Not on file  Relationships  . Social connections:    Talks on phone: Not on file    Gets together: Not on file    Attends religious service: Not on file    Active member of club or organization: Not on file    Attends meetings of clubs or organizations: Not on file    Relationship status: Not on file  Other Topics Concern  . Not on file  Social History Narrative  . Not on file     Family History: The patient's family history includes Cancer in his father and mother.  ROS:   Please see the history of present illness.    ROS  All other systems reviewed and negative.   EKGs/Labs/Other Studies Reviewed:    The following studies were reviewed today: none  EKG:  EKG is not ordered today.    Recent Labs: 03/31/2017: BUN 16; Creatinine, Ser 0.80; Hemoglobin 15.7; Platelets 227; Potassium 4.5; Sodium 139   Recent Lipid Panel No results found for: CHOL, TRIG, HDL, CHOLHDL, VLDL, LDLCALC, LDLDIRECT  Physical Exam:    VS:  BP (!) 126/46   Pulse 75   Wt 201 lb 12.8 oz (91.5 kg)   SpO2 98%   BMI 30.68 kg/m     Wt Readings from Last 3 Encounters:  10/21/17 201 lb 12.8 oz (91.5 kg)  03/31/17 212 lb 12.8 oz (96.5 kg)  02/18/17 210 lb (95.3 kg)     GEN:  Well nourished, well developed in no acute distress HEENT: Normal NECK: No JVD; No carotid bruits LYMPHATICS: No lymphadenopathy CARDIAC: RRR, no murmurs, rubs, gallops RESPIRATORY:  Clear to auscultation without rales, wheezing or rhonchi  ABDOMEN: Soft, non-tender, non-distended MUSCULOSKELETAL:  No edema; No deformity  SKIN: Warm and dry NEUROLOGIC:  Alert and oriented x 3 PSYCHIATRIC:  Normal affect   ASSESSMENT:    1. Persistent atrial  fibrillation ([place])   2. Essential hypertension   3. OSA (obstructive sleep apnea)    PLAN:    In order of problems listed above:  1.  Persistent atrial fibrillation -he is maintaining normal sinus rhythm on exam.  Continue on flecainide 100 mg twice daily, Cardizem CD 240 mg daily and Xarelto 20 mg daily.  He has not had any bleeding problems.  His creatinine was .  Hemoglobin was 15 on 10/07/2016.  I will check a CBC today.  2.  HTN - blood pressure is well controlled on exam today.  He will continue on losartan 100 mg daily, HCTZ 25 mg daily and diltiazem 240 mg daily  3.  OSA -he failed CPAP titration due to ongoing respiratory events and never followed through with BiPAP titration.  I discussed at length with him the importance of having his sleep apnea treated especially in regards to preventing any further recurrence of atrial fibrillation.  I will set him up for an in lab BiPAP titration.   Medication Adjustments/Labs and Tests Ordered: Current medicines are reviewed at length with the patient today.  Concerns regarding medicines are outlined above.  No orders of the defined types were placed in this encounter.  No orders of the defined types were placed in this encounter.   Signed, [name], MD  10/21/2017 11:14 AM    [place]

## 2017-10-21 NOTE — Patient Instructions (Signed)
Medication Instructions:  Your physician has recommended you make the following change in your medication:  CONTINUE: taking Xarelto 20 mg one time a day   STOP: aspirin  If you need a refill on your cardiac medications, please contact your pharmacy first.  Labwork: Today for complete blood count   Testing/Procedures: None ordered   Follow-Up: Your physician wants you to follow-up in: 6 months with PA. You will receive a reminder letter in the mail two months in advance. If you don't receive a letter, please call our office to schedule the follow-up appointment.  Your physician wants you to follow-up in: 1 year with Dr. Radford Pax. You will receive a reminder letter in the mail two months in advance. If you don't receive a letter, please call our office to schedule the follow-up appointment.  Any Other Special Instructions Will Be Listed Below (If Applicable).   Thank you for choosing [facility], RN  [phone]  If you need a refill on your cardiac medications before your next appointment, please call your pharmacy.

## 2017-10-22 ENCOUNTER — Telehealth: Payer: Self-pay | Admitting: *Deleted

## 2017-10-22 DIAGNOSIS — G4733 Obstructive sleep apnea (adult) (pediatric): Secondary | ICD-10-CM

## 2017-10-22 LAB — CBC
Hematocrit: 36 % — ABNORMAL LOW (ref 37.5–51.0)
Hemoglobin: 12.5 g/dL — ABNORMAL LOW (ref 13.0–17.7)
MCH: 30.3 pg (ref 26.6–33.0)
MCHC: 34.7 g/dL (ref 31.5–35.7)
MCV: 87 fL (ref 79–97)
PLATELETS: 307 10*3/uL (ref 150–379)
RBC: 4.12 x10E6/uL — ABNORMAL LOW (ref 4.14–5.80)
RDW: 13.2 % (ref 12.3–15.4)
WBC: 9.9 10*3/uL (ref 3.4–10.8)

## 2017-10-22 NOTE — Telephone Encounter (Signed)
Bipap Titration

## 2017-10-22 NOTE — Telephone Encounter (Signed)
-----   Message from Teressa Senter, RN sent at 10/21/2017 11:44 AM EDT ----- Regarding: dme order DME order placed.   Thanks  [employer]

## 2017-10-23 ENCOUNTER — Telehealth: Payer: Self-pay | Admitting: *Deleted

## 2017-10-23 NOTE — Telephone Encounter (Signed)
Submitted PA for BIPAP titration submitted to [facility].

## 2017-10-27 NOTE — Addendum Note (Signed)
Addended by: Freada Bergeron on: 10/27/2017 12:07 PM   Modules accepted: Orders

## 2017-10-28 ENCOUNTER — Telehealth: Payer: Self-pay | Admitting: *Deleted

## 2017-10-28 NOTE — Telephone Encounter (Signed)
Faxed clinical notes and sleep studies to [facility] per their request for pending PA approval for in lab BIPAP study.

## 2017-10-29 ENCOUNTER — Other Ambulatory Visit: Payer: Self-pay | Admitting: Cardiology

## 2017-10-30 ENCOUNTER — Telehealth: Payer: Self-pay | Admitting: *Deleted

## 2017-10-30 NOTE — Telephone Encounter (Signed)
Staff message sent to [facility] approval received from [place]. Okay to schedule requested sleep study. [employer] approval  #[id number]. Valid 11/02/17 to 01/31/18.

## 2017-11-02 ENCOUNTER — Encounter: Payer: Self-pay | Admitting: *Deleted

## 2017-11-02 NOTE — Telephone Encounter (Addendum)
  Lauralee Evener, CMA  Freada Bergeron, CMA        Approval from [facility] received. Ok to schedule BIPAP study.        Patient is scheduled for lab study on 11/22/17. Patient understands his sleep study will be done at [facility] sleep lab. Patient understands he will receive a sleep packet in a week or so. Patient understands to call if he does not receive the sleep packet in a timely manner. Patient agrees with treatment and thanked me for call Left detailed message on voicemail with date and time of titration and informed patient to call back to confirm or reschedule.

## 2017-11-04 NOTE — Telephone Encounter (Signed)
Patient called to confirm his appointment for 11/22/17.

## 2017-11-22 ENCOUNTER — Ambulatory Visit (HOSPITAL_BASED_OUTPATIENT_CLINIC_OR_DEPARTMENT_OTHER): Payer: Medicare HMO | Attending: Cardiology | Admitting: Cardiology

## 2017-11-22 VITALS — Ht 68.0 in | Wt 195.0 lb

## 2017-11-22 DIAGNOSIS — G4733 Obstructive sleep apnea (adult) (pediatric): Secondary | ICD-10-CM | POA: Diagnosis not present

## 2017-11-24 NOTE — Procedures (Signed)
   Patient Name: Corey Hood, Corey Hood Date: 11/22/2017 Gender: Male D.O.B: August 31, 1945 Age (years): 48 Referring Provider: [name] MD, ABSM Height (inches): 68 Interpreting Physician: [name] MD, ABSM Weight (lbs): 195 RPSGT: Zadie Rhine BMI: 30 MRN: [id number] Neck Size: 17.00  CLINICAL INFORMATION The patient is referred for a BiPAP titration to treat sleep apnea.  SLEEP STUDY TECHNIQUE As per the AASM Manual for the Scoring of Sleep and Associated Events v2.3 (April 2016) with a hypopnea requiring 4% desaturations.  The channels recorded and monitored were frontal, central and occipital EEG, electrooculogram (EOG), submentalis EMG (chin), nasal and oral airflow, thoracic and abdominal wall motion, anterior tibialis EMG, snore microphone, electrocardiogram, and pulse oximetry. Bilevel positive airway pressure (BPAP) was initiated at the beginning of the study and titrated to treat sleep-disordered breathing.  MEDICATIONS Medications self-administered by patient taken the night of the study : FLECAINIDE ACETATE  RESPIRATORY PARAMETERS Optimal IPAP Pressure (cm): 16  AHI at Optimal Pressure (/hr) 1.0 Optimal EPAP Pressure (cm):12  Overall Minimal O2 (%):77.0  Minimal O2 at Optimal Pressure (%): 88.0  SLEEP ARCHITECTURE Start Time:10:07:56 PM  Stop Time:4:38:10 AM  Total Time (min):390.2  Total Sleep Time (min):133.3 Sleep Latency (min):17.4  Sleep Efficiency (%):34.2%  REM Latency (min):283.0  WASO (min): 239.5 Stage N1 (%):10.5%  Stage N2 (%): 69.3%  Stage N3 (%): 0.0%  Stage R (%):20.25 Supine (%):100.00 Arousal Index (/hr):24.7   CARDIAC DATA The 2 lead EKG demonstrated sinus rhythm. The mean heart rate was 60.9 beats per minute. Other EKG findings include: None.  LEG MOVEMENT DATA The total Periodic Limb Movements of Sleep (PLMS) were 0. The PLMS index was 0.0. A PLMS index of <15 is considered normal in adults.  IMPRESSIONS - An optimal PAP pressure  was selected for this patient ( 16 /12 cm of water) - Central sleep apnea was not noted during this titration (CAI = 0.4/h). - Severe oxygen desaturations were observed during this titration (min O2 = 77.0%). - No snoring was audible during this study. - No cardiac abnormalities were observed during this study. - Clinically significant periodic limb movements were not noted during this study. Arousals associated with PLMs were rare.  DIAGNOSIS - Obstructive Sleep Apnea (327.23 [G47.33 ICD-10])  RECOMMENDATIONS - Trial of BiPAP therapy on 16/12 cm H2O with a Medium size Fisher&Paykel Full Face Mask Simplus mask and heated humidification. - Avoid alcohol, sedatives and other CNS depressants that may worsen sleep apnea and disrupt normal sleep architecture. - Sleep hygiene should be reviewed to assess factors that may improve sleep quality. - Weight management and regular exercise should be initiated or continued. - Return to Sleep Center for re-evaluation after 10 weeks of therapy  [Electronically signed] 11/24/2017 08:34 PM  [name] MD, ABSM Diplomate, American Board of Sleep Medicine

## 2017-11-26 ENCOUNTER — Telehealth: Payer: Self-pay | Admitting: *Deleted

## 2017-11-26 NOTE — Telephone Encounter (Signed)
-----   Message from Sueanne Margarita, MD sent at 11/24/2017  8:48 PM EDT ----- Please let patient know that they had a successful PAP titration and let DME know that orders are in EPIC.  Please set up 10 week OV with me.

## 2017-11-26 NOTE — Telephone Encounter (Signed)
Called sleep results LMTCB 

## 2017-11-27 NOTE — Telephone Encounter (Signed)
Informed patient of sleep study results and patient understanding was verbalized. Patient understands his sleep study showed he had a successful PAP titration and let DME know that orders are in Epic. Upon patient request DME selection is [facility]. Patient understands he will be contacted by [facility] to set up his cpap. Patient understands to call if [facility] does not contact him with new setup in a timely manner. Patient understands they will be called once confirmation has been received from [facility] that they have received their new machine to schedule 10 week follow up appointment.  [facility] notified of new cpap order  Please add to airview Patient was grateful for the call and thanked me.

## 2017-12-08 ENCOUNTER — Telehealth: Payer: Self-pay | Admitting: *Deleted

## 2017-12-08 NOTE — Telephone Encounter (Addendum)
Patient called to request sleep documents be faxed to the [facility] in [place]. I informed patient that the [facility] will need to be able to act as a full DME including fulfilling his supply orders, facilitating any pressure changes or titrations, perform wireless down loads-airview, and any other needs in regards to his BiPAP therapies. Patient is aware that if the [facility] cannot perform these task he may have to go back to [facility]. Patient is agreeable. Will fax all documents to Alice Reichert at [phone]. [facility] [phone] EXT.-[id number], [phone].Marland Kitchen

## 2018-02-09 ENCOUNTER — Ambulatory Visit (HOSPITAL_COMMUNITY)
Admission: RE | Admit: 2018-02-09 | Discharge: 2018-02-09 | Disposition: A | Discharge status: Home / Self Care | Payer: Medicare HMO | Source: Ambulatory Visit | Attending: Nurse Practitioner | Admitting: Nurse Practitioner

## 2018-02-09 ENCOUNTER — Encounter (HOSPITAL_COMMUNITY): Payer: Self-pay | Admitting: Nurse Practitioner

## 2018-02-09 VITALS — BP 136/78 | HR 106 | Ht 68.0 in | Wt 204.0 lb

## 2018-02-09 DIAGNOSIS — I4819 Other persistent atrial fibrillation: Secondary | ICD-10-CM

## 2018-02-09 DIAGNOSIS — Z87891 Personal history of nicotine dependence: Secondary | ICD-10-CM | POA: Insufficient documentation

## 2018-02-09 DIAGNOSIS — I4891 Unspecified atrial fibrillation: Secondary | ICD-10-CM | POA: Diagnosis present

## 2018-02-09 DIAGNOSIS — Z6831 Body mass index (BMI) 31.0-31.9, adult: Secondary | ICD-10-CM | POA: Insufficient documentation

## 2018-02-09 DIAGNOSIS — I481 Persistent atrial fibrillation: Secondary | ICD-10-CM | POA: Insufficient documentation

## 2018-02-09 DIAGNOSIS — M170 Bilateral primary osteoarthritis of knee: Secondary | ICD-10-CM | POA: Diagnosis not present

## 2018-02-09 DIAGNOSIS — Z880 Allergy status to penicillin: Secondary | ICD-10-CM | POA: Insufficient documentation

## 2018-02-09 DIAGNOSIS — Z7901 Long term (current) use of anticoagulants: Secondary | ICD-10-CM | POA: Insufficient documentation

## 2018-02-09 DIAGNOSIS — E669 Obesity, unspecified: Secondary | ICD-10-CM | POA: Insufficient documentation

## 2018-02-09 DIAGNOSIS — Z888 Allergy status to other drugs, medicaments and biological substances status: Secondary | ICD-10-CM | POA: Insufficient documentation

## 2018-02-09 DIAGNOSIS — I10 Essential (primary) hypertension: Secondary | ICD-10-CM | POA: Diagnosis not present

## 2018-02-09 DIAGNOSIS — G4733 Obstructive sleep apnea (adult) (pediatric): Secondary | ICD-10-CM | POA: Diagnosis not present

## 2018-02-09 DIAGNOSIS — Z79899 Other long term (current) drug therapy: Secondary | ICD-10-CM | POA: Diagnosis not present

## 2018-02-09 DIAGNOSIS — N400 Enlarged prostate without lower urinary tract symptoms: Secondary | ICD-10-CM | POA: Diagnosis not present

## 2018-02-09 DIAGNOSIS — Z885 Allergy status to narcotic agent status: Secondary | ICD-10-CM | POA: Insufficient documentation

## 2018-02-09 LAB — BASIC METABOLIC PANEL
Anion gap: 8 (ref 5–15)
BUN: 17 mg/dL (ref 8–23)
CALCIUM: 9.4 mg/dL (ref 8.9–10.3)
CO2: 27 mmol/L (ref 22–32)
Chloride: 104 mmol/L (ref 98–111)
Creatinine, Ser: 0.87 mg/dL (ref 0.61–1.24)
Glucose, Bld: 98 mg/dL (ref 70–99)
Potassium: 4.4 mmol/L (ref 3.5–5.1)
Sodium: 139 mmol/L (ref 135–145)

## 2018-02-09 LAB — CBC
HEMATOCRIT: 43.7 % (ref 39.0–52.0)
Hemoglobin: 14.9 g/dL (ref 13.0–17.0)
MCH: 31.2 pg (ref 26.0–34.0)
MCHC: 34.1 g/dL (ref 30.0–36.0)
MCV: 91.6 fL (ref 78.0–100.0)
PLATELETS: 322 10*3/uL (ref 150–400)
RBC: 4.77 MIL/uL (ref 4.22–5.81)
RDW: 12.6 % (ref 11.5–15.5)
WBC: 8.3 10*3/uL (ref 4.0–10.5)

## 2018-02-09 NOTE — Patient Instructions (Addendum)
Cardioversion scheduled for Tuesday, August 6th  - Arrive at the [facility] and go to admitting at [place] not eat or drink anything after midnight the night prior to your procedure.  - Take all your morning medication with a sip of water prior to arrival.  - You will not be able to drive home after your procedure.

## 2018-02-09 NOTE — Progress Notes (Signed)
Patient ID: Corey Hood, male   DOB: [birth year], 72 y.o.   MRN: [id number]     Primary Care Physician: [name]., MD Referring Physician: ER f/u   Corey Hood is a 72 y.o. male with a h/o PAF, first diagnosed in May of 2015 and treated with flecainide 100 mg  bid and DCCV. He has been maintaining SR until 2016,when he woke up at 4 am and was in afib. He went to the ER and was there for many hours, given extra cardizem with slowing of v rates but did not cardiovert.  He was in afib with a  v rate 107 bpm. He was then successfully cardioverted fall of 2016.  Pt called and asked to be seen today for being in afib for almost one week. He had his toenail removed last week  and was in a lot of pain associated with it. On his own, he did not take eliquis for 3-4 days before the procedure wanting to limit bleeding. His podiatrist told him it was not necessary to stop anticoagulation  for toe nail removal. He is using Bipap.  Today, he denies symptoms of palpitations, chest pain, shortness of breath, orthopnea, PND, lower extremity edema, dizziness, presyncope, syncope, or neurologic sequela. The patient is tolerating medications without difficulties and is otherwise without complaint today.   Past Medical History:  Diagnosis Date  . Arthritis of both knees   . BPH (benign prostatic hyperplasia)   . Hypertension   . Obesity (BMI 30-39.9) 04/08/2016  . OSA (obstructive sleep apnea) 03/30/2017   Moderate with AHI 21.3/hr with nocturnal hypoxemia by sleep study  . Persistent atrial fibrillation (HCC)    s/p TEE/DCCV.  He is on Xarelto for a CHADS2VASC score 2   Past Surgical History:  Procedure Laterality Date  . CARDIOVERSION N/A 12/09/2013   Procedure: CARDIOVERSION;  Surgeon: Larey Dresser, MD;  Location: [facility] ENDOSCOPY;  Service: Cardiovascular;  Laterality: N/A;  . CARDIOVERSION N/A 05/14/2015   Procedure: CARDIOVERSION;  Surgeon: Pixie Casino, MD;  Location: [facility];  Service:  Cardiovascular;  Laterality: N/A;  . CERVICAL SPINE SURGERY    . TEE WITHOUT CARDIOVERSION N/A 12/09/2013   Procedure: TRANSESOPHAGEAL ECHOCARDIOGRAM (TEE);  Surgeon: Larey Dresser, MD;  Location[facility] ENDOSCOPY;  Service: Cardiovascular;  Laterality: N/A;  . TEE WITHOUT CARDIOVERSION N/A 05/14/2015   Procedure: TRANSESOPHAGEAL ECHOCARDIOGRAM (TEE);  Surgeon: Pixie Casino, MD;  Location[facility] ENDOSCOPY;  Service: Cardiovascular;  Laterality: N/A;    Current Outpatient Medications  Medication Sig Dispense Refill  . albuterol (PROAIR HFA) 108 (90 Base) MCG/ACT inhaler Inhale 2 puffs into the lungs Hood 4 (four) hours as needed. (wheezing)    . calcium-vitamin D (OSCAL WITH D) 500-200 MG-UNIT per tablet Take 1 tablet by mouth daily with breakfast.    . Coenzyme Q10 (COQ10) 100 MG CAPS Take 1 capsule by mouth daily.    Marland Kitchen diltiazem (TIAZAC) 240 MG 24 hr capsule Take 240 mg by mouth daily.    . finasteride (PROSCAR) 5 MG tablet Take 5 mg by mouth daily.  0  . flecainide (TAMBOCOR) 100 MG tablet Take 1 tablet (100 mg total) by mouth Hood 12 (twelve) hours. 60 tablet 11  . hydrochlorothiazide (HYDRODIURIL) 25 MG tablet Take 1 tablet (25 mg total) by mouth daily. (Patient taking differently: Take 12.5 mg by mouth daily. ) 30 tablet 11  . KRILL OIL PO Take 1 capsule by mouth daily.    Marland Kitchen losartan (COZAAR)  100 MG tablet Take 1 tablet (100 mg total) by mouth daily. 30 tablet 11  . Magnesium 250 MG TABS Take 1 tablet by mouth daily.    . Multiple Vitamin (MULTIVITAMIN WITH MINERALS) TABS tablet Take 1 tablet by mouth daily.    Marland Kitchen OVER THE COUNTER MEDICATION Take 1 capsule by mouth daily. Pt takes Livaplex by mouth daily    . rivaroxaban (XARELTO) 20 MG TABS tablet Take 1 tablet (20 mg total) by mouth daily with supper. 30 tablet 6  . Tragacanth (ASTRAGALUS ROOT) POWD Take 1 capsule by mouth daily.     No current facility-administered medications for this encounter.     Allergies  Allergen Reactions    . Benazepril Cough  . Codeine Other (See Comments)    Other reaction(s): Other (See Comments) "makes me loopy" "makes me loopy" "makes me loopy"  . Penicillins Hives    Social History   Socioeconomic History  . Marital status: Married    Spouse name: Not on file  . Number of children: Not on file  . Years of education: Not on file  . Highest education level: Not on file  Occupational History  . Not on file  Social Needs  . Financial resource strain: Not on file  . Food insecurity:    Worry: Not on file    Inability: Not on file  . Transportation needs:    Medical: Not on file    Non-medical: Not on file  Tobacco Use  . Smoking status: Former Research scientist (life sciences)  . Smokeless tobacco: Never Used  Substance and Sexual Activity  . Alcohol use: Yes    Alcohol/week: 16.8 oz    Types: 7 Glasses of wine, 21 Cans of beer per week  . Drug use: No  . Sexual activity: Not on file  Lifestyle  . Physical activity:    Days per week: Not on file    Minutes per session: Not on file  . Stress: Not on file  Relationships  . Social connections:    Talks on phone: Not on file    Gets together: Not on file    Attends religious service: Not on file    Active member of club or organization: Not on file    Attends meetings of clubs or organizations: Not on file    Relationship status: Not on file  . Intimate partner violence:    Fear of current or ex partner: Not on file    Emotionally abused: Not on file    Physically abused: Not on file    Forced sexual activity: Not on file  Other Topics Concern  . Not on file  Social History Narrative  . Not on file    Family History  Problem Relation Age of Onset  . Cancer Mother   . Cancer Father     ROS- All systems are reviewed and negative except as per the HPI above  Physical Exam: Vitals:   02/09/18 1521  BP: 136/78  Pulse: (!) 106  Weight: 204 lb (92.5 kg)  Height: 5\' 8"  (1.727 m)    GEN- The patient is well appearing, alert and  oriented x 3 today.   Head- normocephalic, atraumatic Eyes-  Sclera clear, conjunctiva pink Ears- hearing intact Oropharynx- clear Neck- supple, no JVP Lymph- no cervical lymphadenopathy Lungs- Clear to ausculation bilaterally, normal work of breathing Heart- irregular rate and rhythm, no murmurs, rubs or gallops, PMI not laterally displaced GI- soft, NT, ND, + BS Extremities- no clubbing,  cyanosis, or edema MS- no significant deformity or atrophy Skin- no rash or lesion Psych- euthymic mood, full affect Neuro- strength and sensation are intact  EKG- NSR, 68 bpm, IRBBB, pr  Int 196 ms, QRS int 104 ms, QTc 414 ms Epic records reviewed   Assessment and Plan: 1. Persistent afib Went out of rhythm around one week ago, possibly 2/2 pain related to toe nail removal and associated pain one week ago Will schedule for TEE cardioversion as he missed several doses prior to toe nail surgery, he does not want to wait an additional two weeks for adequate anticoagualtion Continue Cardizem  240 mg daily Continue flecainide 100 mg bid Do not miss any further  doses of xarelto Continue  to use Bipap   F/u in afib clinic one week after cardioversion   Butch Penny C. James Senn, [facility] [address] [place], [place] [address] [phone]

## 2018-02-16 ENCOUNTER — Ambulatory Visit (HOSPITAL_COMMUNITY)
Admission: RE | Admit: 2018-02-16 | Discharge: 2018-02-16 | Disposition: A | Discharge status: Home / Self Care | Payer: Medicare HMO | Source: Ambulatory Visit | Attending: Internal Medicine | Admitting: Internal Medicine

## 2018-02-16 ENCOUNTER — Encounter (HOSPITAL_COMMUNITY)
Admission: RE | Disposition: A | Discharge status: Home / Self Care | Payer: Self-pay | Source: Ambulatory Visit | Attending: Internal Medicine

## 2018-02-16 ENCOUNTER — Encounter (HOSPITAL_COMMUNITY): Payer: Self-pay | Admitting: *Deleted

## 2018-02-16 ENCOUNTER — Other Ambulatory Visit: Payer: Self-pay

## 2018-02-16 ENCOUNTER — Ambulatory Visit (HOSPITAL_BASED_OUTPATIENT_CLINIC_OR_DEPARTMENT_OTHER)
Admission: RE | Admit: 2018-02-16 | Discharge: 2018-02-16 | Disposition: A | Discharge status: Home / Self Care | Payer: Medicare HMO | Source: Ambulatory Visit | Attending: Internal Medicine | Admitting: Internal Medicine

## 2018-02-16 ENCOUNTER — Other Ambulatory Visit: Payer: Self-pay | Admitting: Internal Medicine

## 2018-02-16 ENCOUNTER — Ambulatory Visit (HOSPITAL_COMMUNITY): Payer: Medicare HMO | Admitting: Anesthesiology

## 2018-02-16 DIAGNOSIS — N400 Enlarged prostate without lower urinary tract symptoms: Secondary | ICD-10-CM | POA: Diagnosis not present

## 2018-02-16 DIAGNOSIS — Z6830 Body mass index (BMI) 30.0-30.9, adult: Secondary | ICD-10-CM | POA: Insufficient documentation

## 2018-02-16 DIAGNOSIS — Z79899 Other long term (current) drug therapy: Secondary | ICD-10-CM | POA: Diagnosis not present

## 2018-02-16 DIAGNOSIS — M170 Bilateral primary osteoarthritis of knee: Secondary | ICD-10-CM | POA: Insufficient documentation

## 2018-02-16 DIAGNOSIS — G4730 Sleep apnea, unspecified: Secondary | ICD-10-CM | POA: Insufficient documentation

## 2018-02-16 DIAGNOSIS — I420 Dilated cardiomyopathy: Secondary | ICD-10-CM | POA: Insufficient documentation

## 2018-02-16 DIAGNOSIS — I481 Persistent atrial fibrillation: Secondary | ICD-10-CM | POA: Diagnosis not present

## 2018-02-16 DIAGNOSIS — M5137 Other intervertebral disc degeneration, lumbosacral region: Secondary | ICD-10-CM | POA: Diagnosis not present

## 2018-02-16 DIAGNOSIS — K760 Fatty (change of) liver, not elsewhere classified: Secondary | ICD-10-CM | POA: Diagnosis not present

## 2018-02-16 DIAGNOSIS — Z87891 Personal history of nicotine dependence: Secondary | ICD-10-CM | POA: Diagnosis not present

## 2018-02-16 DIAGNOSIS — I4891 Unspecified atrial fibrillation: Secondary | ICD-10-CM | POA: Insufficient documentation

## 2018-02-16 DIAGNOSIS — I4892 Unspecified atrial flutter: Secondary | ICD-10-CM | POA: Diagnosis not present

## 2018-02-16 DIAGNOSIS — Z86010 Personal history of colonic polyps: Secondary | ICD-10-CM | POA: Diagnosis not present

## 2018-02-16 DIAGNOSIS — R9431 Abnormal electrocardiogram [ECG] [EKG]: Secondary | ICD-10-CM

## 2018-02-16 DIAGNOSIS — I10 Essential (primary) hypertension: Secondary | ICD-10-CM | POA: Insufficient documentation

## 2018-02-16 DIAGNOSIS — M1990 Unspecified osteoarthritis, unspecified site: Secondary | ICD-10-CM | POA: Diagnosis not present

## 2018-02-16 DIAGNOSIS — E669 Obesity, unspecified: Secondary | ICD-10-CM | POA: Insufficient documentation

## 2018-02-16 DIAGNOSIS — Z9889 Other specified postprocedural states: Secondary | ICD-10-CM | POA: Insufficient documentation

## 2018-02-16 DIAGNOSIS — I058 Other rheumatic mitral valve diseases: Secondary | ICD-10-CM | POA: Insufficient documentation

## 2018-02-16 HISTORY — PX: CARDIOVERSION: SHX1299

## 2018-02-16 HISTORY — PX: TEE WITHOUT CARDIOVERSION: SHX5443

## 2018-02-16 SURGERY — ECHOCARDIOGRAM, TRANSESOPHAGEAL
Anesthesia: General

## 2018-02-16 MED ORDER — PROPOFOL 500 MG/50ML IV EMUL
INTRAVENOUS | Status: DC | PRN
  Administered 2018-02-16: 50 ug/kg/min via INTRAVENOUS

## 2018-02-16 MED ORDER — PROPOFOL 10 MG/ML IV BOLUS
INTRAVENOUS | Status: DC | PRN
  Administered 2018-02-16: 30 mg via INTRAVENOUS
  Administered 2018-02-16: 20 mg via INTRAVENOUS

## 2018-02-16 MED ORDER — LACTATED RINGERS IV SOLN
INTRAVENOUS | Status: DC | PRN
  Administered 2018-02-16: 13:00:00 via INTRAVENOUS

## 2018-02-16 MED ORDER — LIDOCAINE HCL (CARDIAC) PF 100 MG/5ML IV SOSY
PREFILLED_SYRINGE | INTRAVENOUS | Status: DC | PRN
  Administered 2018-02-16: 100 mg via INTRATRACHEAL

## 2018-02-16 MED ORDER — BUTAMBEN-TETRACAINE-BENZOCAINE 2-2-14 % EX AERO
INHALATION_SPRAY | CUTANEOUS | Status: DC | PRN
  Administered 2018-02-16: 2 via TOPICAL

## 2018-02-16 NOTE — Anesthesia Postprocedure Evaluation (Signed)
Anesthesia Post Note  Patient: Corey Hood  Procedure(s) Performed: TRANSESOPHAGEAL ECHOCARDIOGRAM (TEE) (N/A ) CARDIOVERSION (N/A )     Patient location during evaluation: Endoscopy Anesthesia Type: General Level of consciousness: awake and alert, oriented and patient cooperative Pain management: pain level controlled Vital Signs Assessment: post-procedure vital signs reviewed and stable Respiratory status: spontaneous breathing and respiratory function stable Cardiovascular status: blood pressure returned to baseline and stable Postop Assessment: no headache, adequate PO intake, patient able to bend at knees and no apparent nausea or vomiting Anesthetic complications: no    Last Vitals:  Vitals:   02/16/18 1309 02/16/18 1415  BP: (!) 157/82 108/64  Pulse:  78  Resp: 13 12  Temp: 36.7 C 36.7 C  SpO2: 100% 98%    Last Pain:  Vitals:   02/16/18 1415  TempSrc: Oral  PainSc: 0-No pain                 Tarvis Blossom

## 2018-02-16 NOTE — CV Procedure (Signed)
TEE/CARDIOVERSION NOTE  TRANSESOPHAGEAL ECHOCARDIOGRAM (TEE):  Indictation: Atrial Fibrillation  Consent:   Informed consent was obtained prior to the procedure. The risks, benefits and alternatives for the procedure were discussed and the patient comprehended these risks.  Risks include, but are not limited to, cough, sore throat, vomiting, nausea, somnolence, esophageal and stomach trauma or perforation, bleeding, low blood pressure, aspiration, pneumonia, infection, trauma to the teeth and death.    Time Out: Verified patient identification, verified procedure, site/side was marked, verified correct patient position, special equipment/implants available, medications/allergies/relevent history reviewed, required imaging and test results available. Performed  Procedure:  After a procedural time-out, the patient was given propofol per anesthesia for sedation. The patient's heart rate, blood pressure, and oxygen saturation are monitored continuously during the procedure. The oropharynx was anesthetized with 2 topical cetacaine sprays.  The transesophageal probe was inserted in the esophagus and stomach without difficulty and multiple views were obtained. Agitated microbubble saline contrast was not administered.  Complications:    Complications: None Patient did tolerate procedure well.  Findings:  1. LEFT VENTRICLE: The left ventricular wall thickness is mildly increased.  The left ventricular cavity is normal in size. Wall motion is normal.  LVEF is 60-65%.  2. RIGHT VENTRICLE:  The right ventricle is normal in structure and function without any thrombus or masses.    3. LEFT ATRIUM:  The left atrium is moderately dilated in size without any thrombus or masses.  There is not spontaneous echo contrast ("smoke") in the left atrium consistent with a low flow state.  4. LEFT ATRIAL APPENDAGE:  The left atrial appendage is free of any thrombus or masses. The appendage has single lobes.  Pulse doppler indicates moderate flow in the appendage.  5. ATRIAL SEPTUM:  The atrial septum appears intact and is free of thrombus and/or masses.  There is no evidence for interatrial shunting by color doppler.  6. RIGHT ATRIUM:  The right atrium is normal in size and function without any thrombus or masses.  7. MITRAL VALVE:  The mitral valve is demonstrates late systolic prolapse of the posterior leaflet with  Mild anteriorly directed regurgitation.  There were no vegetations or stenosis.  8. AORTIC VALVE:  The aortic valve is trileaflet, normal in structure and function with no regurgitation.  There were no vegetations or stenosis  9. TRICUSPID VALVE:  The tricuspid valve is normal in structure and function with trivial regurgitation.  There were no vegetations or stenosis  10.  PULMONIC VALVE:  The pulmonic valve is normal in structure and function with no regurgitation.  There were no vegetations or stenosis.   11. AORTIC ARCH, ASCENDING AND DESCENDING AORTA:  Not visualized  12. PULMONARY VEINS: Anomalous pulmonary venous return was not noted.  13. PERICARDIUM: The pericardium appeared normal and non-thickened.  There is no pericardial effusion.  CARDIOVERSION:     Second Time Out: Verified patient identification, verified procedure, site/side was marked, verified correct patient position, special equipment/implants available, medications/allergies/relevent history reviewed, required imaging and test results available.  Performed  Procedure:  1. Patient placed on cardiac monitor, pulse oximetry, supplemental oxygen as necessary.  2. Sedation administered per anesthesia 3. Pacer pads placed anterior and posterior chest. 4. Cardioverted 1 time(s).  5. Cardioverted at 120J biphasic.  Complications:  Complications: None Patient did tolerate procedure well.  Impression:  1. No LAA thrombus 2. 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 Moderate LAE 4. LVEF 60-65% 5.  Successful DCCV with a single 120J biphasic shock  Recommendations:  1.  Follow-up with Roderic Palau, NP in the afib clinic.  Time Spent Directly with the Patient:  45 minutes   Pixie Casino, MD, [facility], [place] Director of the Advanced Lipid Disorders &  Cardiovascular Risk Reduction Clinic Diplomate of the American Board of Clinical Lipidology Attending Cardiologist  Direct Dial: [phone]  Fax: [phone]  Website:  [email or web address] 02/16/2018, 2:13 PM

## 2018-02-16 NOTE — Progress Notes (Signed)
  Echocardiogram 2D Echocardiogram has been performed.  Corey Hood 02/16/2018, 2:14 PM

## 2018-02-16 NOTE — Anesthesia Preprocedure Evaluation (Addendum)
Anesthesia Evaluation  Patient identified by MRN, date of birth, ID band Patient awake    Reviewed: Allergy & Precautions, NPO status , Patient's Chart, lab work & pertinent test results  History of Anesthesia Complications (+) Family history of anesthesia reactionNegative for: history of anesthetic complications  Airway Mallampati: II  TM Distance: >3 FB Neck ROM: Limited    Dental  (+) Teeth Intact   Pulmonary sleep apnea and Continuous Positive Airway Pressure Ventilation , former smoker,    breath sounds clear to auscultation       Cardiovascular hypertension, Pt. on medications + dysrhythmias Atrial Fibrillation  Rhythm:Irregular Rate:Normal     Neuro/Psych negative neurological ROS  negative psych ROS   GI/Hepatic negative GI ROS, Neg liver ROS,   Endo/Other   Obesity   Renal/GU negative Renal ROS    BPH     Musculoskeletal  (+) Arthritis ,   Abdominal   Peds  Hematology negative hematology ROS (+)   Anesthesia Other Findings   Reproductive/Obstetrics                            Anesthesia Physical Anesthesia Plan  ASA: III  Anesthesia Plan: General   Post-op Pain Management:    Induction: Intravenous  PONV Risk Score and Plan: 2 and Treatment may vary due to age or medical condition and Propofol infusion  Airway Management Planned: Nasal Cannula and Natural Airway  Additional Equipment: None  Intra-op Plan:   Post-operative Plan:   Informed Consent: I have reviewed the patients History and Physical, chart, labs and discussed the procedure including the risks, benefits and alternatives for the proposed anesthesia with the patient or authorized representative who has indicated his/her understanding and acceptance.     Plan Discussed with: CRNA and Anesthesiologist  Anesthesia Plan Comments:        Anesthesia Quick Evaluation

## 2018-02-16 NOTE — Discharge Instructions (Signed)
Transesophageal Echocardiogram Transesophageal echocardiography (TEE) is a picture test of your heart using sound waves. The pictures taken can give very detailed pictures of your heart. This can help your doctor see if there are problems with your heart. TEE can check:  If your heart has blood clots in it.  How well your heart valves are working.  If you have an infection on the inside of your heart.  Some of the major arteries of your heart.  If your heart valve is working after a Office manager.  Your heart before a procedure that uses a shock to your heart to get the rhythm back to normal.  What happens after the procedure?  You will be taken to a recovery area so the sedative can wear off.  Your throat may be sore and scratchy. This will go away slowly over time.  You will go home when you are fully awake and able to swallow liquids.  You should have someone stay with you for the next 24 hours.  Do not drive or operate machinery for the next 24 hours. This information is not intended to replace advice given to you by your health care provider. Make sure you discuss any questions you have with your health care provider. Document Released: 04/27/2009 Document Revised: 12/06/2015 Document Reviewed: 12/30/2012 Elsevier Interactive Patient Education  2018 [employer]. Electrical Cardioversion, Care After This sheet gives you information about how to care for yourself after your procedure. Your health care provider may also give you more specific instructions. If you have problems or questions, contact your health care provider. What can I expect after the procedure? After the procedure, it is common to have:  Some redness on the skin where the shocks were given.  Follow these instructions at home:  Do not drive for 24 hours if you were given a medicine to help you relax (sedative).  Take over-the-counter and prescription medicines only as told by your health care provider.  Ask  your health care provider how to check your pulse. Check it often.  Rest for 48 hours after the procedure or as told by your health care provider.  Avoid or limit your caffeine use as told by your health care provider. Contact a health care provider if:  You feel like your heart is beating too quickly or your pulse is not regular.  You have a serious muscle cramp that does not go away. Get help right away if:  You have discomfort in your chest.  You are dizzy or you feel faint.  You have trouble breathing or you are short of breath.  Your speech is slurred.  You have trouble moving an arm or leg on one side of your body.  Your fingers or toes turn cold or blue. This information is not intended to replace advice given to you by your health care provider. Make sure you discuss any questions you have with your health care provider. Document Released: 04/20/2013 Document Revised: 02/01/2016 Document Reviewed: 01/04/2016 Elsevier Interactive Patient Education  [employer].

## 2018-02-16 NOTE — H&P (Signed)
   INTERVAL PROCEDURE H&P  History and Physical Interval Note:  02/16/2018 1:09 PM  Dorise Bullion has presented today for their planned procedure. The various methods of treatment have been discussed with the patient and family. After consideration of risks, benefits and other options for treatment, the patient has consented to the procedure.  The patients' outpatient history has been reviewed, patient examined, and no change in status from most recent office note within the past 30 days. I have reviewed the patients' chart and labs and will proceed as planned. Questions were answered to the patient's satisfaction.   Pixie Casino, MD, [facility], [place] Director of the Advanced Lipid Disorders &  Cardiovascular Risk Reduction Clinic Diplomate of the American Board of Clinical Lipidology Attending Cardiologist  Direct Dial: [phone]  Fax: [phone]  Website:  [email or web address] 02/16/2018, 1:09 PM

## 2018-02-16 NOTE — Transfer of Care (Signed)
Immediate Anesthesia Transfer of Care Note  Patient: Corey Hood  Procedure(s) Performed: TRANSESOPHAGEAL ECHOCARDIOGRAM (TEE) (N/A ) CARDIOVERSION (N/A )  Patient Location: Endoscopy Unit  Anesthesia Type:MAC  Level of Consciousness: awake, alert , oriented and patient cooperative  Airway & Oxygen Therapy: Patient Spontanous Breathing and Patient connected to nasal cannula oxygen  Post-op Assessment: Report given to RN and Post -op Vital signs reviewed and stable  Post vital signs: Reviewed and stable  Last Vitals:  Vitals Value Taken Time  BP    Temp    Pulse 78 02/16/2018  2:15 PM  Resp 11 02/16/2018  2:15 PM  SpO2 98 % 02/16/2018  2:15 PM  Vitals shown include unvalidated device data.  Last Pain:  Vitals:   02/16/18 1309  TempSrc: Oral  PainSc: 0-No pain         Complications: No apparent anesthesia complications

## 2018-02-25 ENCOUNTER — Encounter (HOSPITAL_COMMUNITY): Payer: Self-pay | Admitting: Nurse Practitioner

## 2018-02-25 ENCOUNTER — Ambulatory Visit (HOSPITAL_COMMUNITY)
Admission: RE | Admit: 2018-02-25 | Discharge: 2018-02-25 | Disposition: A | Discharge status: Home / Self Care | Payer: Medicare HMO | Source: Ambulatory Visit | Attending: Nurse Practitioner | Admitting: Nurse Practitioner

## 2018-02-25 VITALS — BP 122/60 | HR 71 | Ht 68.0 in | Wt 209.0 lb

## 2018-02-25 DIAGNOSIS — Z6831 Body mass index (BMI) 31.0-31.9, adult: Secondary | ICD-10-CM | POA: Diagnosis not present

## 2018-02-25 DIAGNOSIS — Z7901 Long term (current) use of anticoagulants: Secondary | ICD-10-CM | POA: Diagnosis not present

## 2018-02-25 DIAGNOSIS — Z885 Allergy status to narcotic agent status: Secondary | ICD-10-CM | POA: Insufficient documentation

## 2018-02-25 DIAGNOSIS — I481 Persistent atrial fibrillation: Secondary | ICD-10-CM | POA: Diagnosis not present

## 2018-02-25 DIAGNOSIS — Z880 Allergy status to penicillin: Secondary | ICD-10-CM | POA: Diagnosis not present

## 2018-02-25 DIAGNOSIS — I480 Paroxysmal atrial fibrillation: Secondary | ICD-10-CM

## 2018-02-25 DIAGNOSIS — N400 Enlarged prostate without lower urinary tract symptoms: Secondary | ICD-10-CM | POA: Insufficient documentation

## 2018-02-25 DIAGNOSIS — E669 Obesity, unspecified: Secondary | ICD-10-CM | POA: Insufficient documentation

## 2018-02-25 DIAGNOSIS — G4733 Obstructive sleep apnea (adult) (pediatric): Secondary | ICD-10-CM | POA: Insufficient documentation

## 2018-02-25 DIAGNOSIS — Z79899 Other long term (current) drug therapy: Secondary | ICD-10-CM | POA: Insufficient documentation

## 2018-02-25 DIAGNOSIS — I10 Essential (primary) hypertension: Secondary | ICD-10-CM | POA: Diagnosis not present

## 2018-02-25 DIAGNOSIS — Z87891 Personal history of nicotine dependence: Secondary | ICD-10-CM | POA: Diagnosis not present

## 2018-02-25 NOTE — Progress Notes (Signed)
Patient ID: BAYLOR CORTEZ, male   DOB: [birth year], 72 y.o.   MRN: [id number]     Primary Care Physician: Thomes Dinning, MD Referring Physician: ER f/u   Corey Hood is a 72 y.o. male with a h/o PAF, first diagnosed in May of 2015 and treated with flecainide 100 mg  bid and DCCV. He has been maintaining SR until 2016,when he woke up at 4 am and was in afib. He went to the ER and was there for many hours, given extra cardizem with slowing of v rates but did not cardiovert.  He was in afib with a  v rate 107 bpm. He was then successfully cardioverted fall of 2016.  Pt called and asked to be seen today for being in afib for almost one week. He had his toenail removed last week  and was in a lot of pain associated with it. On his own, he did not take eliquis for 3-4 days before the procedure wanting to limit bleeding. His podiatrist told him it was not necessary to stop anticoagulation  for toe nail removal. He is using Bipap.  F/u in afib clinic, 8/15, after successful TEE guided cardioversion.He remains in SR today.  Today, he denies symptoms of palpitations, chest pain, shortness of breath, orthopnea, PND, lower extremity edema, dizziness, presyncope, syncope, or neurologic sequela. The patient is tolerating medications without difficulties and is otherwise without complaint today.   Past Medical History:  Diagnosis Date  . Arthritis of both knees   . BPH (benign prostatic hyperplasia)   . Hypertension   . Obesity (BMI 30-39.9) 04/08/2016  . OSA (obstructive sleep apnea) 03/30/2017   Moderate with AHI 21.3/hr with nocturnal hypoxemia by sleep study  . Persistent atrial fibrillation (HCC)    s/p TEE/DCCV.  He is on Xarelto for a CHADS2VASC score 2   Past Surgical History:  Procedure Laterality Date  . CARDIOVERSION N/A 12/09/2013   Procedure: CARDIOVERSION;  Surgeon: Larey Dresser, MD;  Location: [facility] ENDOSCOPY;  Service: Cardiovascular;  Laterality: N/A;  . CARDIOVERSION N/A 05/14/2015   Procedure: CARDIOVERSION;  Surgeon: Pixie Casino, MD;  Location: [facility] ENDOSCOPY;  Service: Cardiovascular;  Laterality: N/A;  . CARDIOVERSION N/A 02/16/2018   Procedure: CARDIOVERSION;  Surgeon: Pixie Casino, MD;  Location: [place];  Service: Cardiovascular;  Laterality: N/A;  . CERVICAL SPINE SURGERY    . TEE WITHOUT CARDIOVERSION N/A 12/09/2013   Procedure: TRANSESOPHAGEAL ECHOCARDIOGRAM (TEE);  Surgeon: Larey Dresser, MD;  Location: [facility] ENDOSCOPY;  Service: Cardiovascular;  Laterality: N/A;  . TEE WITHOUT CARDIOVERSION N/A 05/14/2015   Procedure: TRANSESOPHAGEAL ECHOCARDIOGRAM (TEE);  Surgeon: Pixie Casino, MD;  Location: [place];  Service: Cardiovascular;  Laterality: N/A;  . TEE WITHOUT CARDIOVERSION N/A 02/16/2018   Procedure: TRANSESOPHAGEAL ECHOCARDIOGRAM (TEE);  Surgeon: Pixie Casino, MD;  Location: [facility] ENDOSCOPY;  Service: Cardiovascular;  Laterality: N/A;    Current Outpatient Medications  Medication Sig Dispense Refill  . albuterol (PROAIR HFA) 108 (90 Base) MCG/ACT inhaler Inhale 2 puffs into the lungs every 4 (four) hours as needed for wheezing or shortness of breath.     . calcium-vitamin D (OSCAL WITH D) 500-200 MG-UNIT per tablet Take 1 tablet by mouth 3 (three) times a week.     . Coenzyme Q10 (COQ10) 100 MG CAPS Take 100 mg by mouth 2 (two) times daily.     Marland Kitchen diltiazem (TIAZAC) 240 MG 24 hr capsule Take 240 mg by mouth daily.    . finasteride (  PROSCAR) 5 MG tablet Take 5 mg by mouth daily.  0  . flecainide (TAMBOCOR) 100 MG tablet Take 1 tablet (100 mg total) by mouth every 12 (twelve) hours. 60 tablet 11  . hydrochlorothiazide (HYDRODIURIL) 25 MG tablet Take 1 tablet (25 mg total) by mouth daily. (Patient taking differently: Take 12.5 mg by mouth daily. ) 30 tablet 11  . Krill Oil 500 MG CAPS Take 500 mg by mouth daily.     Marland Kitchen losartan (COZAAR) 100 MG tablet Take 1 tablet (100 mg total) by mouth daily. 30 tablet 11  . Magnesium 400 MG TABS Take 400 mg by  mouth daily.     . Multiple Vitamin (MULTIVITAMIN WITH MINERALS) TABS tablet Take 2 tablets by mouth daily.     Marland Kitchen OVER THE COUNTER MEDICATION Take 1 capsule by mouth 2 (two) times daily. Livaplex Supplement    . rivaroxaban (XARELTO) 20 MG TABS tablet Take 1 tablet (20 mg total) by mouth daily with supper. 30 tablet 6  . Tragacanth (ASTRAGALUS ROOT) POWD Take 1 capsule by mouth 2 (two) times daily.      No current facility-administered medications for this encounter.     Allergies  Allergen Reactions  . Benazepril Cough  . Codeine Other (See Comments)    "makes me loopy"  . Penicillins Hives and Other (See Comments)    Has patient had a PCN reaction causing immediate rash, facial/tongue/throat swelling, SOB or lightheadedness with hypotension: Unknown Has patient had a PCN reaction causing severe rash involving mucus membranes or skin necrosis: No Has patient had a PCN reaction that required hospitalization: No Has patient had a PCN reaction occurring within the last 10 years: No If all of the above answers are "NO", then may proceed with Cephalosporin use.     Social History   Socioeconomic History  . Marital status: Married    Spouse name: Not on file  . Number of children: Not on file  . Years of education: Not on file  . Highest education level: Not on file  Occupational History  . Not on file  Social Needs  . Financial resource strain: Not on file  . Food insecurity:    Worry: Not on file    Inability: Not on file  . Transportation needs:    Medical: Not on file    Non-medical: Not on file  Tobacco Use  . Smoking status: Former Research scientist (life sciences)  . Smokeless tobacco: Never Used  Substance and Sexual Activity  . Alcohol use: Yes    Alcohol/week: 28.0 standard drinks    Types: 7 Glasses of wine, 21 Cans of beer per week  . Drug use: No  . Sexual activity: Not on file  Lifestyle  . Physical activity:    Days per week: Not on file    Minutes per session: Not on file  .  Stress: Not on file  Relationships  . Social connections:    Talks on phone: Not on file    Gets together: Not on file    Attends religious service: Not on file    Active member of club or organization: Not on file    Attends meetings of clubs or organizations: Not on file    Relationship status: Not on file  . Intimate partner violence:    Fear of current or ex partner: Not on file    Emotionally abused: Not on file    Physically abused: Not on file    Forced sexual activity: Not  on file  Other Topics Concern  . Not on file  Social History Narrative  . Not on file    Family History  Problem Relation Age of Onset  . Cancer Mother   . Cancer Father     ROS- All systems are reviewed and negative except as per the HPI above  Physical Exam: Vitals:   02/25/18 1106  BP: 122/60  Pulse: 71  Weight: 94.8 kg  Height: 5\' 8"  (1.727 m)    GEN- The patient is well appearing, alert and oriented x 3 today.   Head- normocephalic, atraumatic Eyes-  Sclera clear, conjunctiva pink Ears- hearing intact Oropharynx- clear Neck- supple, no JVP Lymph- no cervical lymphadenopathy Lungs- Clear to ausculation bilaterally, normal work of breathing Heart- regular rate and rhythm, no murmurs, rubs or gallops, PMI not laterally displaced GI- soft, NT, ND, + BS Extremities- no clubbing, cyanosis, or edema MS- no significant deformity or atrophy Skin- no rash or lesion Psych- euthymic mood, full affect Neuro- strength and sensation are intact  EKG- NSR, 71 bpm,SR with first degree AV block, IRBBB, pr  Int 216 ms, QRS int 112 ms, QTc 447 ms Epic records reviewed   Assessment and Plan: 1. Persistent afib Went out of rhythm around one week ago, possibly 2/2  toe nail removal and associated pain   Successful  TEE guided  cardioversion as he missed several doses prior to toe nail surgery, he did not want to wait an additional two weeks for adequate anticoagulation to get cardioverted Remains  in SR Continue Cardizem  240 mg daily Continue flecainide 100 mg bid Do not miss any further  doses of xarelto Continue  to use Bipap   F/u with Dr. Radford Pax as scheduled   Corey Hood, [facility] [address] [place], [place] [address] [phone]

## 2018-04-20 ENCOUNTER — Ambulatory Visit: Payer: Medicare HMO | Admitting: Physician Assistant

## 2018-05-03 NOTE — Progress Notes (Deleted)
Cardiology Office Note    Date:  05/03/2018   ID:  Corey Hood, DOB 1946/02/14, MRN [id number]  PCP:  Thomes Dinning, MD  Cardiologist: No primary care provider on file. EPS: None  No chief complaint on file.   History of Present Illness:  Corey Hood is a 72 y.o. male with history of PAF initially treated with flecainide and DCCV in 2015.  Repeat cardioversion 2016.  Also has hypertension, moderate OSA on both to be on BiPAP.  Last saw Dr. Radford Pax 10/2017 at which time she set him up for BiPAP titration.  Blood pressure was controlled on losartan HCTZ and diltiazem.  Patient saw Roderic Palau, NP 02/25/2018 because he was in atrial fibril most 1 week.  He had stopped his Eliquis for 3 to 4 days before toenail removal to limit bleeding even though the podiatrist told him it was not necessary.  He underwent successful TEE guided cardioversion 02/16/2018 and was in normal sinus rhythm at that follow-up visit.    Past Medical History:  Diagnosis Date  . Arthritis of both knees   . BPH (benign prostatic hyperplasia)   . Hypertension   . Obesity (BMI 30-39.9) 04/08/2016  . OSA (obstructive sleep apnea) 03/30/2017   Moderate with AHI 21.3/hr with nocturnal hypoxemia by sleep study  . Persistent atrial fibrillation (HCC)    s/p TEE/DCCV.  He is on Xarelto for a CHADS2VASC score 2    Past Surgical History:  Procedure Laterality Date  . CARDIOVERSION N/A 12/09/2013   Procedure: CARDIOVERSION;  Surgeon: Larey Dresser, MD;  Location: [facility] ENDOSCOPY;  Service: Cardiovascular;  Laterality: N/A;  . CARDIOVERSION N/A 05/14/2015   Procedure: CARDIOVERSION;  Surgeon: Pixie Casino, MD;  Location: [facility] ENDOSCOPY;  Service: Cardiovascular;  Laterality: N/A;  . CARDIOVERSION N/A 02/16/2018   Procedure: CARDIOVERSION;  Surgeon: Pixie Casino, MD;  Location: [place];  Service: Cardiovascular;  Laterality: N/A;  . CERVICAL SPINE SURGERY    . TEE WITHOUT CARDIOVERSION N/A 12/09/2013   Procedure:  TRANSESOPHAGEAL ECHOCARDIOGRAM (TEE);  Surgeon: Larey Dresser, MD;  Location: [facility] ENDOSCOPY;  Service: Cardiovascular;  Laterality: N/A;  . TEE WITHOUT CARDIOVERSION N/A 05/14/2015   Procedure: TRANSESOPHAGEAL ECHOCARDIOGRAM (TEE);  Surgeon: Pixie Casino, MD;  Location: [place];  Service: Cardiovascular;  Laterality: N/A;  . TEE WITHOUT CARDIOVERSION N/A 02/16/2018   Procedure: TRANSESOPHAGEAL ECHOCARDIOGRAM (TEE);  Surgeon: Pixie Casino, MD;  Location: [facility] ENDOSCOPY;  Service: Cardiovascular;  Laterality: N/A;    Current Medications: No outpatient medications have been marked as taking for the 05/04/18 encounter (Appointment) with Imogene Burn, PA-C.     Allergies:   Benazepril; Codeine; and Penicillins   Social History   Socioeconomic History  . Marital status: Married    Spouse name: Not on file  . Number of children: Not on file  . Years of education: Not on file  . Highest education level: Not on file  Occupational History  . Not on file  Social Needs  . Financial resource strain: Not on file  . Food insecurity:    Worry: Not on file    Inability: Not on file  . Transportation needs:    Medical: Not on file    Non-medical: Not on file  Tobacco Use  . Smoking status: Former Research scientist (life sciences)  . Smokeless tobacco: Never Used  Substance and Sexual Activity  . Alcohol use: Yes    Alcohol/week: 28.0 standard drinks    Types: 7 Glasses of wine,  21 Cans of beer per week  . Drug use: No  . Sexual activity: Not on file  Lifestyle  . Physical activity:    Days per week: Not on file    Minutes per session: Not on file  . Stress: Not on file  Relationships  . Social connections:    Talks on phone: Not on file    Gets together: Not on file    Attends religious service: Not on file    Active member of club or organization: Not on file    Attends meetings of clubs or organizations: Not on file    Relationship status: Not on file  Other Topics Concern  . Not on file    Social History Narrative  . Not on file     Family History:  The patient's ***family history includes Cancer in his father and mother.   ROS:   Please see the history of present illness.    ROS All other systems reviewed and are negative.   PHYSICAL EXAM:   VS:  There were no vitals taken for this visit.  Physical Exam  GEN: Well nourished, well developed, in no acute distress  HEENT: normal  Neck: no JVD, carotid bruits, or masses Cardiac:RRR; no murmurs, rubs, or gallops  Respiratory:  clear to auscultation bilaterally, normal work of breathing GI: soft, nontender, nondistended, + BS Ext: without cyanosis, clubbing, or edema, Good distal pulses bilaterally MS: no deformity or atrophy  Skin: warm and dry, no rash Neuro:  Alert and Oriented x 3, Strength and sensation are intact Psych: euthymic mood, full affect  Wt Readings from Last 3 Encounters:  02/25/18 209 lb (94.8 kg)  02/09/18 204 lb (92.5 kg)  11/22/17 195 lb (88.5 kg)      Studies/Labs Reviewed:   EKG:  EKG is*** ordered today.  The ekg ordered today demonstrates ***  Recent Labs: 02/09/2018: BUN 17; Creatinine, Ser 0.87; Hemoglobin 14.9; Platelets 322; Potassium 4.4; Sodium 139   Lipid Panel No results found for: CHOL, TRIG, HDL, CHOLHDL, VLDL, LDLCALC, LDLDIRECT  Additional studies/ records that were reviewed today include:  TEE 02/16/2018 study Conclusions   - Left ventricle: The cavity size was normal. There was mild   concentric hypertrophy. Systolic function was normal. The   estimated ejection fraction was in the range of 60% to 65%. Wall   motion was normal; there were no regional wall motion   abnormalities. - Aortic valve: No evidence of vegetation. - Aorta: Mild atheromatous disease. 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   Mild, anteriorly directed regurgitation. - Left atrium: Moderately dilated. No evidence of thrombus in the   atrial cavity or appendage. -  Right atrium: The atrium was dilated. - Atrial septum: No defect or patent foramen ovale was identified. - Pulmonic valve: No evidence of vegetation.   Impressions:   - No cardiac source of emboli was indentified. Successful DCCV to   NSR.       ASSESSMENT:    1. Persistent atrial fibrillation   2. Essential hypertension   3. OSA (obstructive sleep apnea)      PLAN:  In order of problems listed above:  PAF on flecainide and Eliquis.  Patient had a recent TEE guided cardioversion because he had stopped Eliquis for 3 to 4 days for toenail removal.  Essential hypertension  OSA on BiPAP    Medication Adjustments/Labs and Tests Ordered: Current medicines are reviewed at length with the patient today.  Concerns regarding medicines are outlined above.  Medication changes, Labs and Tests ordered today are listed in the Patient Instructions below. There are no Patient Instructions on file for this visit.   Signed, Ermalinda Barrios, PA-C  05/03/2018 3:45 PM    [facility] [place], [place], [place]  [address] Phone: [phone]; Fax: [phone]

## 2018-05-04 ENCOUNTER — Ambulatory Visit: Payer: Medicare HMO | Admitting: Physician Assistant

## 2018-05-25 NOTE — Progress Notes (Signed)
Cardiology Office Note    Date:  05/26/2018   ID:  Corey Hood, DOB 1946/06/09, MRN [id number]  PCP:  Thomes Dinning, MD  Cardiologist: [name], MD EPS: None  No chief complaint on file.   History of Present Illness:  Corey Hood is a 72 y.o. male with history of PAF treated with flecainide and DCCV 2015, repeat cardioversion 2016.  Patient went back into atrial fibrillation after toe removal with associated pain.  Because he had missed several doses of Eliquis he underwent TEE guided cardioversion 02/16/2018.  Also has OSA on BiPAP.  Patient comes in for f/u. Says he's had no further Afib. Overall feeling well. Has had kidney stones since then as well and underwent bilateral ureteroscopy and stents 05/07/18. Crt had gone up to 3.18 but now down to .98 on 05/13/18.Doing better on BiPAP. Finally adjusted to it.    Past Medical History:  Diagnosis Date  . Arthritis of both knees   . BPH (benign prostatic hyperplasia)   . Hypertension   . Obesity (BMI 30-39.9) 04/08/2016  . OSA (obstructive sleep apnea) 03/30/2017   Moderate with AHI 21.3/hr with nocturnal hypoxemia by sleep study  . Persistent atrial fibrillation    s/p TEE/DCCV.  He is on Xarelto for a CHADS2VASC score 2    Past Surgical History:  Procedure Laterality Date  . CARDIOVERSION N/A 12/09/2013   Procedure: CARDIOVERSION;  Surgeon: Larey Dresser, MD;  Location: [facility] ENDOSCOPY;  Service: Cardiovascular;  Laterality: N/A;  . CARDIOVERSION N/A 05/14/2015   Procedure: CARDIOVERSION;  Surgeon: Pixie Casino, MD;  Location: [facility] ENDOSCOPY;  Service: Cardiovascular;  Laterality: N/A;  . CARDIOVERSION N/A 02/16/2018   Procedure: CARDIOVERSION;  Surgeon: Pixie Casino, MD;  Location: [place];  Service: Cardiovascular;  Laterality: N/A;  . CERVICAL SPINE SURGERY    . TEE WITHOUT CARDIOVERSION N/A 12/09/2013   Procedure: TRANSESOPHAGEAL ECHOCARDIOGRAM (TEE);  Surgeon: Larey Dresser, MD;  Location: [facility] ENDOSCOPY;   Service: Cardiovascular;  Laterality: N/A;  . TEE WITHOUT CARDIOVERSION N/A 05/14/2015   Procedure: TRANSESOPHAGEAL ECHOCARDIOGRAM (TEE);  Surgeon: Pixie Casino, MD;  Location: [place];  Service: Cardiovascular;  Laterality: N/A;  . TEE WITHOUT CARDIOVERSION N/A 02/16/2018   Procedure: TRANSESOPHAGEAL ECHOCARDIOGRAM (TEE);  Surgeon: Pixie Casino, MD;  Location: [facility] ENDOSCOPY;  Service: Cardiovascular;  Laterality: N/A;    Current Medications: Current Meds  Medication Sig  . albuterol (PROAIR HFA) 108 (90 Base) MCG/ACT inhaler Inhale 2 puffs into the lungs every 4 (four) hours as needed for wheezing or shortness of breath.   . calcium-vitamin D (OSCAL WITH D) 500-200 MG-UNIT per tablet Take 1 tablet by mouth 3 (three) times a week.   . Coenzyme Q10 (COQ10) 100 MG CAPS Take 100 mg by mouth 2 (two) times daily.   Marland Kitchen diltiazem (TIAZAC) 240 MG 24 hr capsule Take 240 mg by mouth daily.  . finasteride (PROSCAR) 5 MG tablet Take 5 mg by mouth daily.  . flecainide (TAMBOCOR) 100 MG tablet Take 1 tablet (100 mg total) by mouth every 12 (twelve) hours.  Javier Docker Oil 500 MG CAPS Take 500 mg by mouth daily.   Marland Kitchen losartan (COZAAR) 100 MG tablet Take 1 tablet (100 mg total) by mouth daily.  . Magnesium 400 MG TABS Take 400 mg by mouth daily.   . Multiple Vitamin (MULTIVITAMIN WITH MINERALS) TABS tablet Take 2 tablets by mouth daily.   Marland Kitchen OVER THE COUNTER MEDICATION Take 1 capsule by mouth  2 (two) times daily. Livaplex Supplement  . rivaroxaban (XARELTO) 20 MG TABS tablet Take 1 tablet (20 mg total) by mouth daily with supper.  . Tragacanth (ASTRAGALUS ROOT) POWD Take 1 capsule by mouth 2 (two) times daily.      Allergies:   Benazepril; Codeine; Penicillins; and Prednisone   Social History   Socioeconomic History  . Marital status: Married    Spouse name: Not on file  . Number of children: Not on file  . Years of education: Not on file  . Highest education level: Not on file  Occupational  History  . Not on file  Social Needs  . Financial resource strain: Not on file  . Food insecurity:    Worry: Not on file    Inability: Not on file  . Transportation needs:    Medical: Not on file    Non-medical: Not on file  Tobacco Use  . Smoking status: Former Research scientist (life sciences)  . Smokeless tobacco: Never Used  Substance and Sexual Activity  . Alcohol use: Yes    Alcohol/week: 28.0 standard drinks    Types: 7 Glasses of wine, 21 Cans of beer per week  . Drug use: No  . Sexual activity: Not on file  Lifestyle  . Physical activity:    Days per week: Not on file    Minutes per session: Not on file  . Stress: Not on file  Relationships  . Social connections:    Talks on phone: Not on file    Gets together: Not on file    Attends religious service: Not on file    Active member of club or organization: Not on file    Attends meetings of clubs or organizations: Not on file    Relationship status: Not on file  Other Topics Concern  . Not on file  Social History Narrative  . Not on file     Family History:  The patient's family history includes Cancer in his father and mother.   ROS:   Please see the history of present illness.    Review of Systems  Constitution: Negative.  HENT: Negative.   Cardiovascular: Negative.   Respiratory: Negative.   Endocrine: Negative.   Hematologic/Lymphatic: Negative.   Musculoskeletal: Negative.   Gastrointestinal: Negative.   Genitourinary: Positive for hematuria.  Neurological: Negative.    All other systems reviewed and are negative.   PHYSICAL EXAM:   VS:  BP (!) 120/54   Pulse 67   Ht 5\' 8"  (1.727 m)   Wt 212 lb (96.2 kg)   SpO2 97%   BMI 32.23 kg/m   Physical Exam  GEN: Well nourished, well developed, in no acute distress  Neck: no JVD, carotid bruits, or masses Cardiac:RRR; no murmurs, rubs, or gallops  Respiratory:  clear to auscultation bilaterally, normal work of breathing GI: soft, nontender, nondistended, + BS Ext:  without cyanosis, clubbing, or edema, Good distal pulses bilaterally Neuro:  Alert and Oriented x 3 Psych: euthymic mood, full affect  Wt Readings from Last 3 Encounters:  05/26/18 212 lb (96.2 kg)  02/25/18 209 lb (94.8 kg)  02/09/18 204 lb (92.5 kg)      Studies/Labs Reviewed:   EKG:  EKG is not ordered today.   Recent Labs: 02/09/2018: BUN 17; Creatinine, Ser 0.87; Hemoglobin 14.9; Platelets 322; Potassium 4.4; Sodium 139   Lipid Panel No results found for: CHOL, TRIG, HDL, CHOLHDL, VLDL, LDLCALC, LDLDIRECT  Additional studies/ records that were reviewed today include:  TEE  02/16/2018 Study Conclusions   - Left ventricle: The cavity size was normal. There was mild   concentric hypertrophy. Systolic function was normal. The   estimated ejection fraction was in the range of 60% to 65%. Wall   motion was normal; there were no regional wall motion   abnormalities. - Aortic valve: No evidence of vegetation. - Aorta: Mild atheromatous disease. 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   Mild, anteriorly directed regurgitation. - Left atrium: Moderately dilated. No evidence of thrombus in the   atrial cavity or appendage. - Right atrium: The atrium was dilated. - Atrial septum: No defect or patent foramen ovale was identified. - Pulmonic valve: No evidence of vegetation.   Impressions:   - No cardiac source of emboli was indentified. Successful DCCV to   NSR.      ASSESSMENT:    1. Persistent atrial fibrillation   2. Essential hypertension   3. OSA (obstructive sleep apnea)      PLAN:  In order of problems listed above:  Persistent atrial fibrillation on Xarelto with recent TEE guided cardioversion 02/16/2018-had some hematuria with kidney stones and creatinine went up to 3.18 but had surgery with stenting and creatinine has now normalized.  Essential hypertension blood pressure well controlled  OSA on BiPAP as adjusted.  Follow-up with Dr.  Radford Pax in 4 months.    Medication Adjustments/Labs and Tests Ordered: Current medicines are reviewed at length with the patient today.  Concerns regarding medicines are outlined above.  Medication changes, Labs and Tests ordered today are listed in the Patient Instructions below. Patient Instructions  Medication Instructions:  Your physician recommends that you continue on your current medications as directed. Please refer to the Current Medication list given to you today. 3 If you need a refill on your cardiac medications before your next appointment, please call your pharmacy.   Lab work: None ordered  If you have labs (blood work) drawn today and your tests are completely normal, you will receive your results only by: Marland Kitchen MyChart Message (if you have MyChart) OR . A paper copy in the mail If you have any lab test that is abnormal or we need to change your treatment, we will call you to review the results.  Testing/Procedures: None ordered   Follow-Up: Your physician recommends that you schedule a follow-up appointment in: 4 MONTHS WITH DR. Radford Pax  Any Other Special Instructions Will Be Listed Below (If Applicable).       Signed, Ermalinda Barrios, PA-C  05/26/2018 9:20 AM    [facility] [address], [place], [place]  [address] Phone: [phone]; Fax: [phone]

## 2018-05-26 ENCOUNTER — Encounter: Payer: Self-pay | Admitting: Physician Assistant

## 2018-05-26 ENCOUNTER — Ambulatory Visit: Payer: Medicare HMO | Admitting: Physician Assistant

## 2018-05-26 VITALS — BP 120/54 | HR 67 | Ht 68.0 in | Wt 212.0 lb

## 2018-05-26 DIAGNOSIS — I10 Essential (primary) hypertension: Secondary | ICD-10-CM

## 2018-05-26 DIAGNOSIS — I4819 Other persistent atrial fibrillation: Secondary | ICD-10-CM | POA: Diagnosis not present

## 2018-05-26 DIAGNOSIS — G4733 Obstructive sleep apnea (adult) (pediatric): Secondary | ICD-10-CM | POA: Diagnosis not present

## 2018-05-26 NOTE — Patient Instructions (Signed)
Medication Instructions:  Your physician recommends that you continue on your current medications as directed. Please refer to the Current Medication list given to you today. 3 If you need a refill on your cardiac medications before your next appointment, please call your pharmacy.   Lab work: None ordered  If you have labs (blood work) drawn today and your tests are completely normal, you will receive your results only by: Marland Kitchen MyChart Message (if you have MyChart) OR . A paper copy in the mail If you have any lab test that is abnormal or we need to change your treatment, we will call you to review the results.  Testing/Procedures: None ordered   Follow-Up: Your physician recommends that you schedule a follow-up appointment in: 4 MONTHS WITH DR. Radford Pax  Any Other Special Instructions Will Be Listed Below (If Applicable).

## 2018-09-15 NOTE — Progress Notes (Signed)
Cardiology Office Note:    Date:  09/16/2018   ID:  Corey Hood, DOB Nov 07, 1945, MRN [id number]  PCP:  Thomes Dinning, MD  Cardiologist:  [name], MD    Referring MD: Thomes Dinning, MD   Chief Complaint  Patient presents with  . Atrial Fibrillation  . Hypertension  . Sleep Apnea    History of Present Illness:    Corey Hood is a 73 y.o. male with a hx of persistentAF s/p TEE/DCCV 04/2015,HTNand moderate OSA with AHI 21.3/hr with nocturnal hypoxemia and oxygen desaturations as low as 70%.  He did not tolerate CPAP due to ongoing respiratory events and underwent BiPAP titration to 16/12cm H2O.    He is here today for followup and is doing well.  He denies any chest pain or pressure, SOB, DOE, PND, orthopnea, LE edema, dizziness, palpitations or syncope. He is compliant with his meds and is tolerating meds with no SE.  He is doing well with his CPAP device and thinks that he has gotten used to it.  He tolerates the mask and feels the pressure is adequate.  Since going on CPAP He feels rested in the am and has no significant daytime sleepiness.  He denies any significant mouth or nasal dryness or nasal congestion.  He does not think that he snores.     Past Medical History:  Diagnosis Date  . Arthritis of both knees   . BPH (benign prostatic hyperplasia)   . Hypertension   . Obesity (BMI 30-39.9) 04/08/2016  . OSA (obstructive sleep apnea) 03/30/2017   Moderate with AHI 21.3/hr with nocturnal hypoxemia by sleep study  . Persistent atrial fibrillation    s/p TEE/DCCV.  He is on Xarelto for a CHADS2VASC score 2    Past Surgical History:  Procedure Laterality Date  . CARDIOVERSION N/A 12/09/2013   Procedure: CARDIOVERSION;  Surgeon: Larey Dresser, MD;  Location: [facility] ENDOSCOPY;  Service: Cardiovascular;  Laterality: N/A;  . CARDIOVERSION N/A 05/14/2015   Procedure: CARDIOVERSION;  Surgeon: Pixie Casino, MD;  Location: [facility] ENDOSCOPY;  Service: Cardiovascular;   Laterality: N/A;  . CARDIOVERSION N/A 02/16/2018   Procedure: CARDIOVERSION;  Surgeon: Pixie Casino, MD;  Location: [place];  Service: Cardiovascular;  Laterality: N/A;  . CERVICAL SPINE SURGERY    . TEE WITHOUT CARDIOVERSION N/A 12/09/2013   Procedure: TRANSESOPHAGEAL ECHOCARDIOGRAM (TEE);  Surgeon: Larey Dresser, MD;  Location: [facility] ENDOSCOPY;  Service: Cardiovascular;  Laterality: N/A;  . TEE WITHOUT CARDIOVERSION N/A 05/14/2015   Procedure: TRANSESOPHAGEAL ECHOCARDIOGRAM (TEE);  Surgeon: Pixie Casino, MD;  Location: [place];  Service: Cardiovascular;  Laterality: N/A;  . TEE WITHOUT CARDIOVERSION N/A 02/16/2018   Procedure: TRANSESOPHAGEAL ECHOCARDIOGRAM (TEE);  Surgeon: Pixie Casino, MD;  Location: [facility] ENDOSCOPY;  Service: Cardiovascular;  Laterality: N/A;    Current Medications: Current Meds  Medication Sig  . albuterol (PROAIR HFA) 108 (90 Base) MCG/ACT inhaler Inhale 2 puffs into the lungs every 4 (four) hours as needed for wheezing or shortness of breath.   . calcium-vitamin D (OSCAL WITH D) 500-200 MG-UNIT per tablet Take 1 tablet by mouth 3 (three) times a week.   . Coenzyme Q10 (COQ10) 100 MG CAPS Take 100 mg by mouth 2 (two) times daily.   Marland Kitchen diltiazem (TIAZAC) 240 MG 24 hr capsule Take 240 mg by mouth daily.  . finasteride (PROSCAR) 5 MG tablet Take 5 mg by mouth daily.  . flecainide (TAMBOCOR) 100 MG tablet Take 1 tablet (100 mg  total) by mouth every 12 (twelve) hours.  Javier Docker Oil 500 MG CAPS Take 500 mg by mouth daily.   Marland Kitchen losartan (COZAAR) 100 MG tablet Take 1 tablet (100 mg total) by mouth daily.  . Magnesium 400 MG TABS Take 400 mg by mouth daily.   . Multiple Vitamin (MULTIVITAMIN WITH MINERALS) TABS tablet Take 2 tablets by mouth daily.   Marland Kitchen OVER THE COUNTER MEDICATION Take 1 capsule by mouth 2 (two) times daily. Livaplex Supplement  . Potassium Citrate 15 MEQ (1620 MG) TBCR Take 15 mEq by mouth 2 (two) times daily.  . rivaroxaban (XARELTO) 20 MG TABS  tablet Take 1 tablet (20 mg total) by mouth daily with supper.  . Tragacanth (ASTRAGALUS ROOT) POWD Take 1 capsule by mouth 2 (two) times daily.      Allergies:   Benazepril; Codeine; Penicillins; and Prednisone   Social History   Socioeconomic History  . Marital status: Married    Spouse name: Not on file  . Number of children: Not on file  . Years of education: Not on file  . Highest education level: Not on file  Occupational History  . Not on file  Social Needs  . Financial resource strain: Not on file  . Food insecurity:    Worry: Not on file    Inability: Not on file  . Transportation needs:    Medical: Not on file    Non-medical: Not on file  Tobacco Use  . Smoking status: Former Research scientist (life sciences)  . Smokeless tobacco: Never Used  Substance and Sexual Activity  . Alcohol use: Yes    Alcohol/week: 28.0 standard drinks    Types: 7 Glasses of wine, 21 Cans of beer per week  . Drug use: No  . Sexual activity: Not on file  Lifestyle  . Physical activity:    Days per week: Not on file    Minutes per session: Not on file  . Stress: Not on file  Relationships  . Social connections:    Talks on phone: Not on file    Gets together: Not on file    Attends religious service: Not on file    Active member of club or organization: Not on file    Attends meetings of clubs or organizations: Not on file    Relationship status: Not on file  Other Topics Concern  . Not on file  Social History Narrative  . Not on file     Family History: The patient's family history includes Cancer in his father and mother.  ROS:   Please see the history of present illness.    ROS  All other systems reviewed and negative.   EKGs/Labs/Other Studies Reviewed:    The following studies were reviewed today: PAP downoad  EKG:  EKG is not ordered today.   Recent Labs: 02/09/2018: BUN 17; Creatinine, Ser 0.87; Hemoglobin 14.9; Platelets 322; Potassium 4.4; Sodium 139   Recent Lipid Panel No  results found for: CHOL, TRIG, HDL, CHOLHDL, VLDL, LDLCALC, LDLDIRECT  Physical Exam:    VS:  BP 134/68   Pulse 71   Ht 5\' 8"  (1.727 m)   Wt 206 lb 12.8 oz (93.8 kg)   BMI 31.44 kg/m     Wt Readings from Last 3 Encounters:  09/16/18 206 lb 12.8 oz (93.8 kg)  05/26/18 212 lb (96.2 kg)  02/25/18 209 lb (94.8 kg)     GEN:  Well nourished, well developed in no acute distress HEENT: Normal NECK: No  JVD; No carotid bruits LYMPHATICS: No lymphadenopathy CARDIAC: RRR, no murmurs, rubs, gallops RESPIRATORY:  Clear to auscultation without rales, wheezing or rhonchi  ABDOMEN: Soft, non-tender, non-distended MUSCULOSKELETAL:  No edema; No deformity  SKIN: Warm and dry NEUROLOGIC:  Alert and oriented x 3 PSYCHIATRIC:  Normal affect   ASSESSMENT:    1. OSA (obstructive sleep apnea)   2. Essential hypertension   3. Obesity (BMI 30-39.9)   4. Persistent atrial fibrillation    PLAN:    In order of problems listed above:  1.  OSA - the patient is tolerating PAP therapy well without any problems. The patient has been using and benefiting from PAP use and will continue to benefit from therapy. I will get a download from his DME  2.  HTN - BP is controlled on current meds.  Continue losartan 100mg  daily and diltiazem 240mg  daily.    3.  Obesity - I have encouraged him to get into a routine exercise program and cut back on carbs and portions.   4.  PAF - he is maintaining NSR on exam.  He will continue on Diltiazem 240mg  dialy, flecainide 100mg  BID Xarelto 20mg  daily.  I will repeat BMET and CBC today.    Medication Adjustments/Labs and Tests Ordered: Current medicines are reviewed at length with the patient today.  Concerns regarding medicines are outlined above.  No orders of the defined types were placed in this encounter.  No orders of the defined types were placed in this encounter.   Signed, [name], MD  09/16/2018 10:38 AM    [place]

## 2018-09-16 ENCOUNTER — Ambulatory Visit: Payer: Medicare HMO | Admitting: Cardiology

## 2018-09-16 ENCOUNTER — Encounter: Payer: Self-pay | Admitting: Cardiology

## 2018-09-16 VITALS — BP 134/68 | HR 71 | Ht 68.0 in | Wt 206.8 lb

## 2018-09-16 DIAGNOSIS — I4819 Other persistent atrial fibrillation: Secondary | ICD-10-CM | POA: Diagnosis not present

## 2018-09-16 DIAGNOSIS — G4733 Obstructive sleep apnea (adult) (pediatric): Secondary | ICD-10-CM

## 2018-09-16 DIAGNOSIS — E669 Obesity, unspecified: Secondary | ICD-10-CM | POA: Diagnosis not present

## 2018-09-16 DIAGNOSIS — I10 Essential (primary) hypertension: Secondary | ICD-10-CM

## 2018-09-16 LAB — BASIC METABOLIC PANEL
BUN / CREAT RATIO: 22 (ref 10–24)
BUN: 20 mg/dL (ref 8–27)
CO2: 25 mmol/L (ref 20–29)
CREATININE: 0.91 mg/dL (ref 0.76–1.27)
Calcium: 9.5 mg/dL (ref 8.6–10.2)
Chloride: 99 mmol/L (ref 96–106)
GFR calc Af Amer: 97 mL/min/{1.73_m2} (ref >59)
GFR, EST NON AFRICAN AMERICAN: 84 mL/min/{1.73_m2} (ref >59)
Glucose: 96 mg/dL (ref 65–99)
Potassium: 4.5 mmol/L (ref 3.5–5.2)
SODIUM: 139 mmol/L (ref 134–144)

## 2018-09-16 LAB — CBC
Hematocrit: 38.7 % (ref 37.5–51.0)
Hemoglobin: 13.4 g/dL (ref 13.0–17.7)
MCH: 32 pg (ref 26.6–33.0)
MCHC: 34.6 g/dL (ref 31.5–35.7)
MCV: 92 fL (ref 79–97)
PLATELETS: 326 10*3/uL (ref 150–450)
RBC: 4.19 x10E6/uL (ref 4.14–5.80)
RDW: 13 % (ref 11.6–15.4)
WBC: 8.8 10*3/uL (ref 3.4–10.8)

## 2018-09-16 NOTE — Patient Instructions (Signed)
Medication Instructions:  Your physician recommends that you continue on your current medications as directed. Please refer to the Current Medication list given to you today.  If you need a refill on your cardiac medications before your next appointment, please call your pharmacy.   Lab work: Today: BMET and CBC  If you have labs (blood work) drawn today and your tests are completely normal, you will receive your results only by: Marland Kitchen MyChart Message (if you have MyChart) OR . A paper copy in the mail If you have any lab test that is abnormal or we need to change your treatment, we will call you to review the results.  Testing/Procedures: None  Follow-Up: At [facility], you and your health needs are our priority.  As part of our continuing mission to provide you with exceptional heart care, we have created designated Provider Care Teams.  These Care Teams include your primary Cardiologist (physician) and Advanced Practice Providers (APPs -  Physician Assistants and Nurse Practitioners) who all work together to provide you with the care you need, when you need it.  Your physician wants you to follow-up in: 6 months with the PA. You will receive a reminder letter in the mail two months in advance. If you don't receive a letter, please call our office to schedule the follow-up appointment.   You will need a follow up appointment in 1 years.  Please call our office 2 months in advance to schedule this appointment.  You may see [name], MD or one of the following Advanced Practice Providers on your designated Care Team:   Cuart[place]z, PA-C Melina Copa, PA-C . Ermalinda Barrios, PA-C

## 2018-10-21 ENCOUNTER — Telehealth: Payer: Self-pay | Admitting: *Deleted

## 2018-10-21 NOTE — Telephone Encounter (Signed)
   Primary Cardiologist: [name], MD  Chart reviewed as part of pre-operative protocol coverage. Given past medical history and time since last visit, based on ACC/AHA guidelines, AIKEN WITHEM would be at acceptable risk for the planned procedure without further cardiovascular testing.   Per pharmacy:  Patient with diagnosis of Afib on Xarelto for anticoagulation.    Procedure: colonoscopy Date of procedure: pending  CHADS2-VASc score of  2 (CHF, HTN, AGE, DM2, stroke/tia x 2, CAD, AGE, male)  CrCl 58ml/min  Per office protocol, patient can hold Xarelto for 2 days prior to procedure.     I will route this recommendation to the requesting party via Epic fax function and remove from pre-op pool.  Please call with questions.  Kathyrn Drown, NP 10/21/2018, 1:04 PM

## 2018-10-21 NOTE — Telephone Encounter (Signed)
   [facility] Pre-operative Risk Assessment    Request for surgical clearance:  1. What type of surgery is being performed? Colonoscopy  2. When is this surgery scheduled? Pending Clearance  3. What type of clearance is required (medical clearance vs. Pharmacy clearance to hold med vs. Both)? Pharmacy  4. Are there any medications that need to be held prior to surgery and how long? Xarelto, 2-3 days prior procedure  5. Practice name and name of physician performing surgery? [facility], Dr. Dorrene German  6. What is your office phone number [phone]   7.   What is your office fax number [phone]  8.   Anesthesia type (None, local, MAC, general) ? Pending Clearance   Dejay Kronk 10/21/2018, 12:24 PM  _________________________________________________________________   (provider comments below)

## 2018-10-21 NOTE — Telephone Encounter (Signed)
Patient with diagnosis of Afib on Xarelto for anticoagulation.    Procedure: colonoscopy Date of procedure: pending  CHADS2-VASc score of  2 (CHF, HTN, AGE, DM2, stroke/tia x 2, CAD, AGE, male)  CrCl 55ml/min  Per office protocol, patient can hold Xarelto for 2 days prior to procedure.

## 2018-11-02 ENCOUNTER — Ambulatory Visit: Payer: Medicare HMO | Admitting: Cardiology

## 2019-01-29 ENCOUNTER — Other Ambulatory Visit: Payer: Self-pay | Admitting: Urology

## 2019-01-29 DIAGNOSIS — C61 Malignant neoplasm of prostate: Secondary | ICD-10-CM

## 2019-02-22 IMAGING — MR MR PROSTATE WO/W CM
55 series · 55 of 55 positions shown · IV contrast (20 MH)
Comparison: MR [place] prostate dated 11/29/2014

CLINICAL DATA: Prostate cancer, Gleason score 6, small volume in
the left lateral mid gland

EXAM:
MR PROSTATE WITHOUT AND WITH CONTRAST
TECHNIQUE: Multiplanar multisequence MRI images were obtained of the pelvis
centered about the prostate. Pre and post contrast images were
obtained.
CONTRAST:  20 mL multihance IV

[Series 3: T1 · axial · 8.0mm · 0.78mm/px · 1 of 32 slices shown (1 of 2)]
[im 1/32]
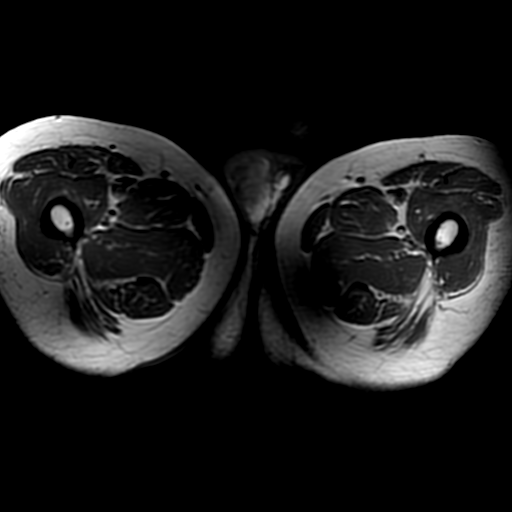

[Series 6: bSSFP fat-sat · axial · 8.0mm · 0.78mm/px · 1 of 32 slices shown]
[im 1/32]
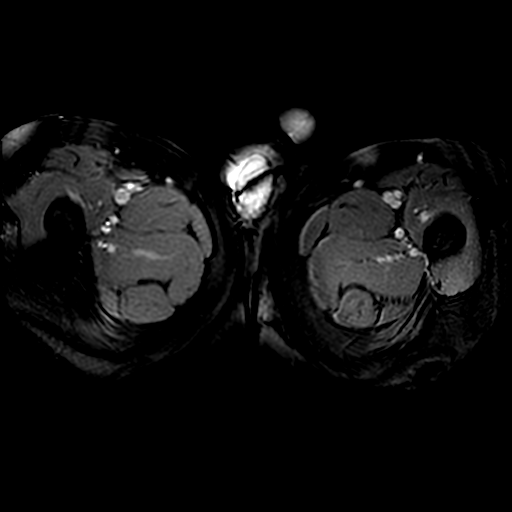

[Series 7: T2 · sagittal · 3.5mm · 0.62mm/px · 1 of 32 slices shown (1 of 3)]
[im 1/32]
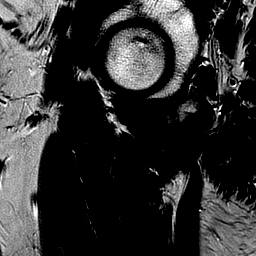

[Series 9: DWI · axial · 3.0mm · 0.70mm/px · 1 of 45 slices shown]
[im 1/45]
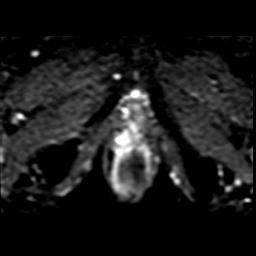

[Series 10: T1 · axial · 3.0mm · 0.31mm/px · 1 of 22 slices shown (2 of 2)]
[im 1/22]
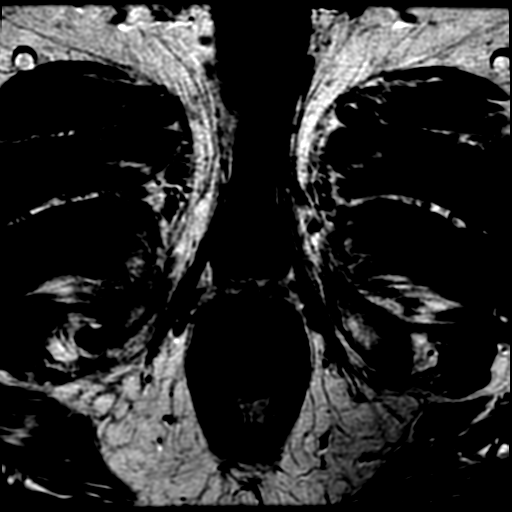

[Series 11: T2 · axial · 3.0mm · 0.62mm/px · 1 of 22 slices shown (2 of 3)]
[im 1/22]
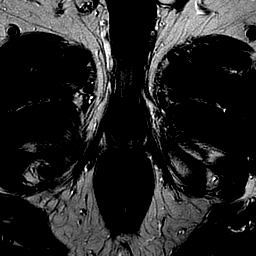

[Series 12: T2 · coronal · 3.0mm · 0.62mm/px · 1 of 24 slices shown (3 of 3)]
[im 1/24]
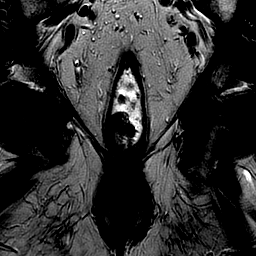

[Series 950: ADC · axial · 3.0mm · 0.70mm/px · 1 of 15 slices shown]
[im 1/15]
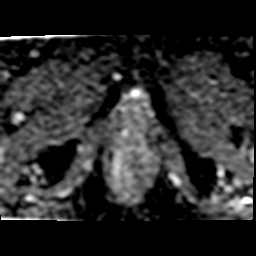

[Series 1300: T1 dynamic · axial · 4.0mm · 0.94mm/px · 1 of 26 slices shown (1 of 24)]
[im 1/26]
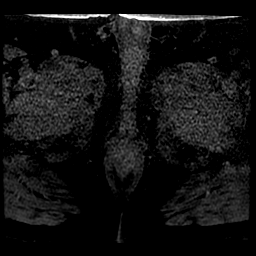

[Series 1301: T1 dynamic · axial · 4.0mm · 0.94mm/px · 1 of 26 slices shown (2 of 24)]
[im 1/26]
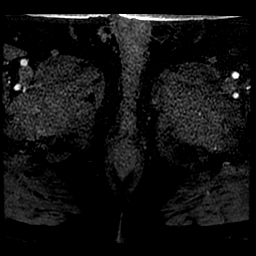

[Series 1302: T1 dynamic · axial · 4.0mm · 0.94mm/px · 1 of 26 slices shown (3 of 24)]
[im 1/26]
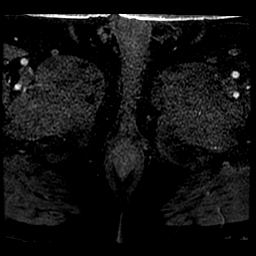

[Series 1303: T1 dynamic · axial · 4.0mm · 0.94mm/px · 1 of 26 slices shown (4 of 24)]
[im 1/26]
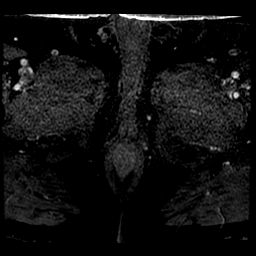

[Series 1304: T1 dynamic · axial · 4.0mm · 0.94mm/px · 1 of 26 slices shown (5 of 24)]
[im 1/26]
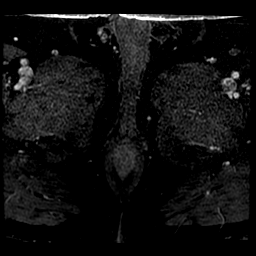

[Series 1305: T1 dynamic · axial · 4.0mm · 0.94mm/px · 1 of 26 slices shown (6 of 24)]
[im 1/26]
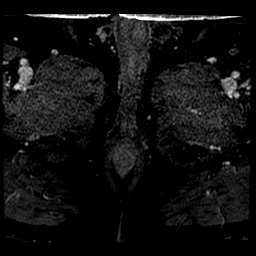

[Series 1306: T1 dynamic · axial · 4.0mm · 0.94mm/px · 1 of 26 slices shown (7 of 24)]
[im 1/26]
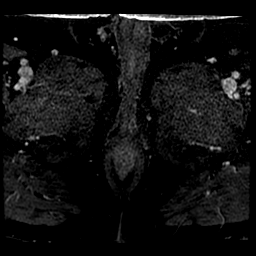

[Series 1307: T1 dynamic · axial · 4.0mm · 0.94mm/px · 1 of 26 slices shown (8 of 24)]
[im 1/26]
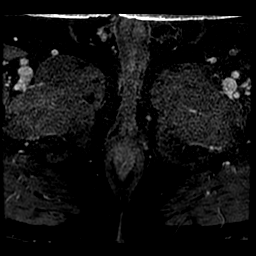

[Series 1308: T1 dynamic · axial · 4.0mm · 0.94mm/px · 1 of 26 slices shown (9 of 24)]
[im 1/26]
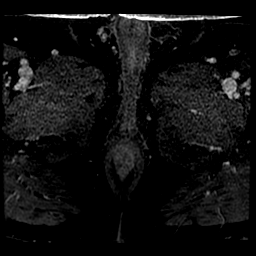

[Series 1309: T1 dynamic · axial · 4.0mm · 0.94mm/px · 1 of 26 slices shown (10 of 24)]
[im 1/26]
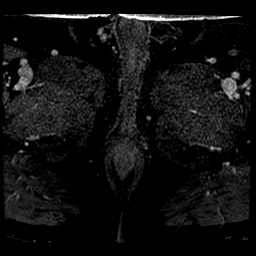

[Series 1310: T1 dynamic · axial · 4.0mm · 0.94mm/px · 1 of 26 slices shown (11 of 24)]
[im 1/26]
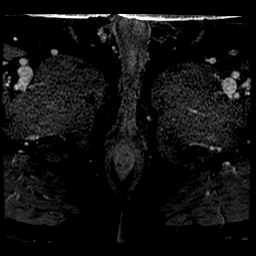

[Series 1311: T1 dynamic · axial · 4.0mm · 0.94mm/px · 1 of 26 slices shown (12 of 24)]
[im 1/26]
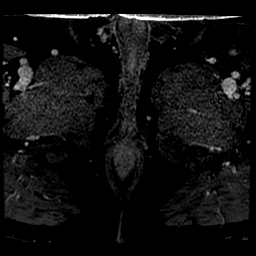

[Series 1312: T1 dynamic · axial · 4.0mm · 0.94mm/px · 1 of 26 slices shown (13 of 24)]
[im 1/26]
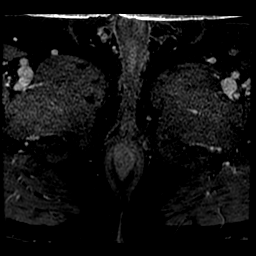

[Series 1313: T1 dynamic · axial · 4.0mm · 0.94mm/px · 1 of 26 slices shown (14 of 24)]
[im 1/26]
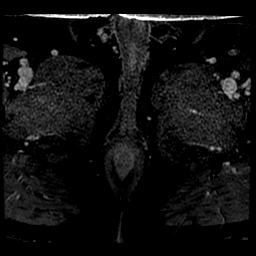

[Series 1314: T1 dynamic · axial · 4.0mm · 0.94mm/px · 1 of 26 slices shown (15 of 24)]
[im 1/26]
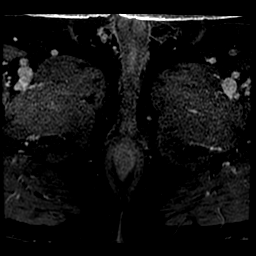

[Series 1315: T1 dynamic · axial · 4.0mm · 0.94mm/px · 1 of 26 slices shown (16 of 24)]
[im 1/26]
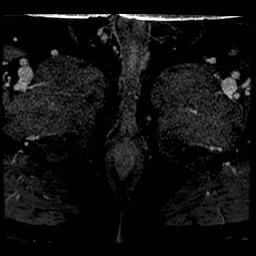

[Series 1316: T1 dynamic · axial · 4.0mm · 0.94mm/px · 1 of 26 slices shown (17 of 24)]
[im 1/26]
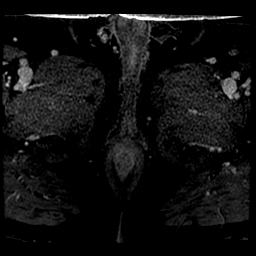

[Series 1317: T1 dynamic · axial · 4.0mm · 0.94mm/px · 1 of 26 slices shown (18 of 24)]
[im 1/26]
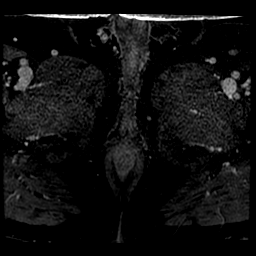

[Series 1318: T1 dynamic · axial · 4.0mm · 0.94mm/px · 1 of 26 slices shown (19 of 24)]
[im 1/26]
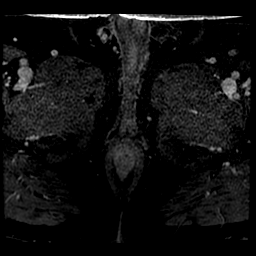

[Series 1319: T1 dynamic · axial · 4.0mm · 0.94mm/px · 1 of 26 slices shown (20 of 24)]
[im 1/26]
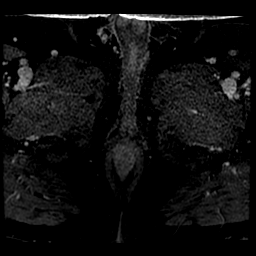

[Series 1320: T1 dynamic · axial · 4.0mm · 0.94mm/px · 1 of 26 slices shown (21 of 24)]
[im 1/26]
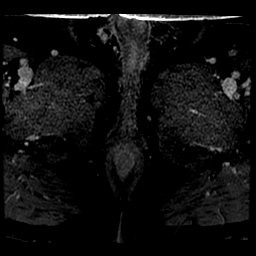

[Series 1321: T1 dynamic · axial · 4.0mm · 0.94mm/px · 1 of 26 slices shown (22 of 24)]
[im 1/26]
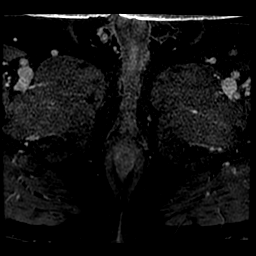

[Series 1322: T1 dynamic · axial · 4.0mm · 0.94mm/px · 1 of 26 slices shown (23 of 24)]
[im 1/26]
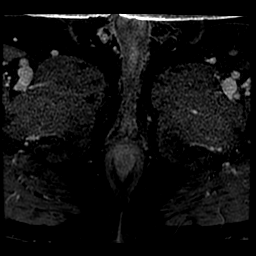

[Series 1323: T1 dynamic · axial · 4.0mm · 0.94mm/px · 1 of 26 slices shown (24 of 24)]
[im 1/26]
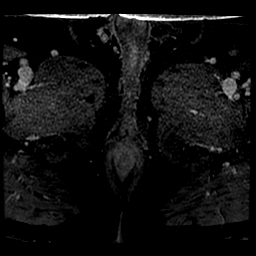

[((id)/(id)/1)-((id)/(id)/1) · axial · 4.0mm · 0.94mm/px · 1 of 1 slices shown (1 of 23)]
[im 1/1]
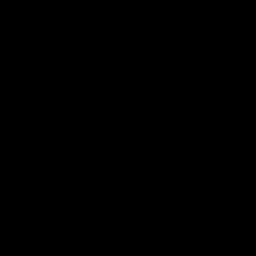

[((id)/(id)/1)-((id)/(id)/1) · axial · 4.0mm · 0.94mm/px · 1 of 17 slices shown (2 of 23)]
[im 1/17]
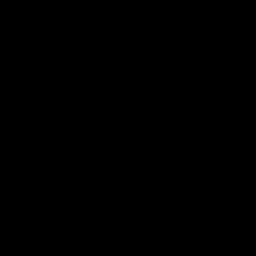

[((id)/(id)/1)-((id)/(id)/1) · axial · 4.0mm · 0.94mm/px · 1 of 26 slices shown (3 of 23)]
[im 1/26]
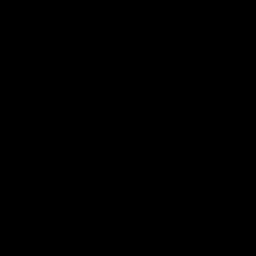

[((id)/(id)/1)-((id)/(id)/1) · axial · 4.0mm · 0.94mm/px · 1 of 26 slices shown (4 of 23)]
[im 1/26]
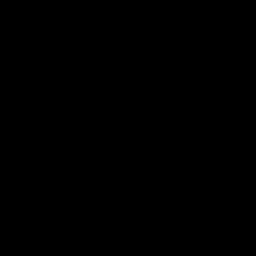

[((id)/(id)/1)-((id)/(id)/1) · axial · 4.0mm · 0.94mm/px · 1 of 26 slices shown (5 of 23)]
[im 1/26]
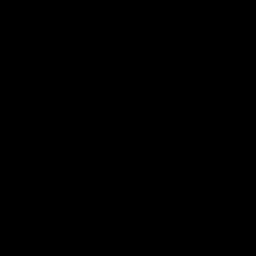

[((id)/(id)/1)-((id)/(id)/1) · axial · 4.0mm · 0.94mm/px · 1 of 26 slices shown (6 of 23)]
[im 1/26]
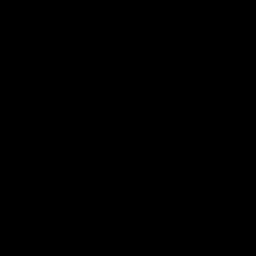

[((id)/(id)/1)-((id)/(id)/1) · axial · 4.0mm · 0.94mm/px · 1 of 26 slices shown (7 of 23)]
[im 1/26]
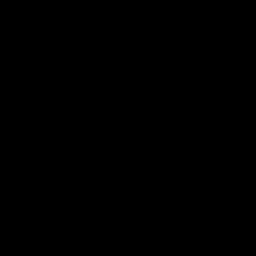

[((id)/(id)/1)-((id)/(id)/1) · axial · 4.0mm · 0.94mm/px · 1 of 26 slices shown (8 of 23)]
[im 1/26]
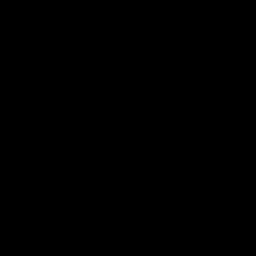

[((id)/(id)/1)-((id)/(id)/1) · axial · 4.0mm · 0.94mm/px · 1 of 26 slices shown (9 of 23)]
[im 1/26]
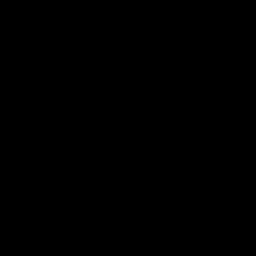

[((id)/(id)/1)-((id)/(id)/1) · axial · 4.0mm · 0.94mm/px · 1 of 26 slices shown (10 of 23)]
[im 1/26]
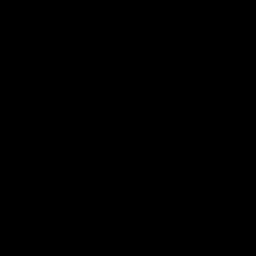

[((id)/(id)/1)-((id)/(id)/1) · axial · 4.0mm · 0.94mm/px · 1 of 25 slices shown (11 of 23)]
[im 1/25]
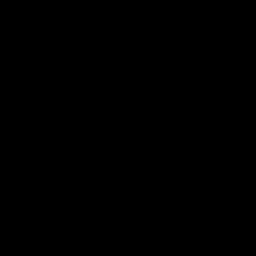

[((id)/(id)/1)-((id)/(id)/1) · axial · 4.0mm · 0.94mm/px · 1 of 26 slices shown (12 of 23)]
[im 1/26]
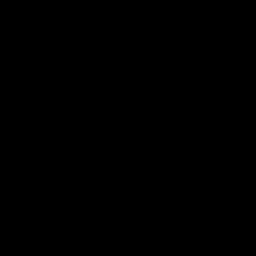

[((id)/(id)/1)-((id)/(id)/1) · axial · 4.0mm · 0.94mm/px · 1 of 26 slices shown (13 of 23)]
[im 1/26]
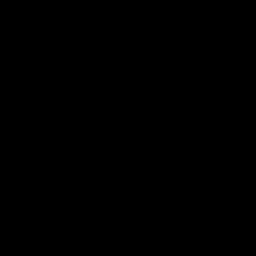

[((id)/(id)/1)-((id)/(id)/1) · axial · 4.0mm · 0.94mm/px · 1 of 26 slices shown (14 of 23)]
[im 1/26]
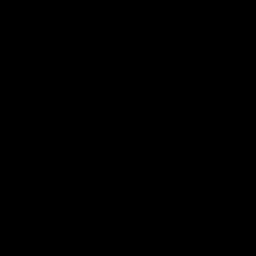

[((id)/(id)/1)-((id)/(id)/1) · axial · 4.0mm · 0.94mm/px · 1 of 26 slices shown (15 of 23)]
[im 1/26]
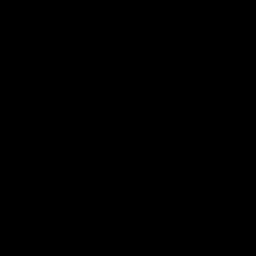

[((id)/(id)/1)-((id)/(id)/1) · axial · 4.0mm · 0.94mm/px · 1 of 26 slices shown (16 of 23)]
[im 1/26]
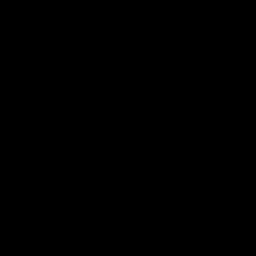

[((id)/(id)/1)-((id)/(id)/1) · axial · 4.0mm · 0.94mm/px · 1 of 26 slices shown (17 of 23)]
[im 1/26]
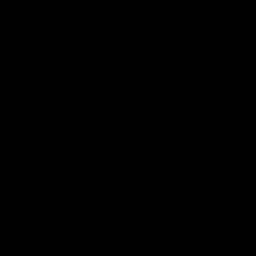

[((id)/(id)/1)-((id)/(id)/1) · axial · 4.0mm · 0.94mm/px · 1 of 26 slices shown (18 of 23)]
[im 1/26]
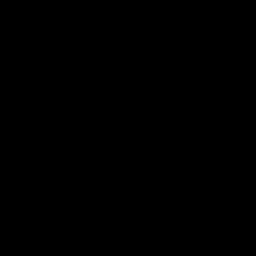

[((id)/(id)/1)-((id)/(id)/1) · axial · 4.0mm · 0.94mm/px · 1 of 26 slices shown (19 of 23)]
[im 1/26]
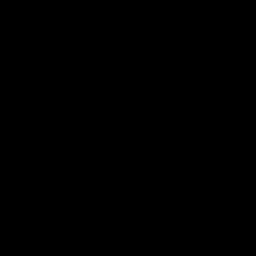

[((id)/(id)/1)-((id)/(id)/1) · axial · 4.0mm · 0.94mm/px · 1 of 26 slices shown (20 of 23)]
[im 1/26]
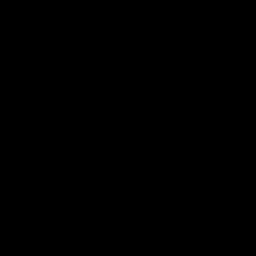

[((id)/(id)/1)-((id)/(id)/1) · axial · 4.0mm · 0.94mm/px · 1 of 26 slices shown (21 of 23)]
[im 1/26]
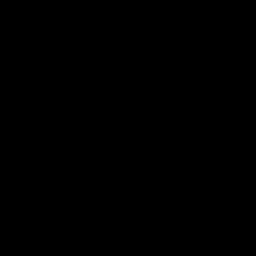

[((id)/(id)/1)-((id)/(id)/1) · axial · 4.0mm · 0.94mm/px · 1 of 26 slices shown (22 of 23)]
[im 1/26]
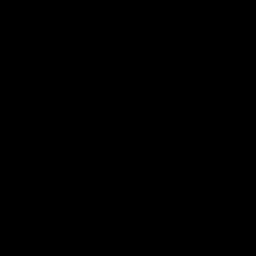

[((id)/(id)/1)-((id)/(id)/1) · axial · 4.0mm · 0.94mm/px · 1 of 26 slices shown (23 of 23)]
[im 1/26]
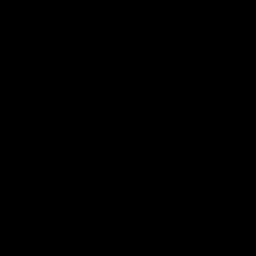

[55 of 55 positions shown; findings below may reference images not displayed]

FINDINGS: Prostate: 5 mm lesion along the right posterolateral mid gland apex
(series 11/ image 11). Associated low ADC/ restricted diffusion
(series 950/ image 36). Mild early arterial enhancement (series 13,
image 2/ image 13).

Additional 5 mm lesion in the posteromedial right apex (series 11/
image 13). Associated low ADC/restricted diffusion (series 902/
image 33). Associated early arterial enhancement (series 3307/ image
15).

These findings are both suggestive of small volume macroscopic
prostate cancer, possibly high-grade.

Volume: 4.3 x 5.1 x 4.4 cm (volume = 51 mL)

Transcapsular spread:  Absent.

Seminal vesicle involvement: Absent.

Neurovascular bundle involvement: Absent.

Pelvic adenopathy: Absent.

Bone metastasis: Absent.

Other findings: None.
IMPRESSION: Two 5 mm lesions in the right posterior mid gland apex, suspicious
for small volume macroscopic prostate cancer, possibly high-grade.
PIRADS 4.

No evidence of extracapsular extension, seminal vesicle invasion, or
metastatic disease.

## 2019-03-14 NOTE — Progress Notes (Signed)
Cardiology Office Note    Date:  03/15/2019   ID:  Corey Hood, DOB [birth year], MRN [place]:[id number]  PCP:  Thomes Dinning, MD  Cardiologist: [name], MD EPS: None  Chief Complaint  Patient presents with  . Follow-up    History of Present Illness:  Corey Hood is a 73 y.o. male  with history of PAF treated with flecainide and DCCV 2015, repeat cardioversion 2016.  Patient went back into atrial fibrillation after toe removal.  Because he had missed several doses of Eliquis he underwent TEE guided cardioversion 02/16/2018.  Also has OSA on BiPAP.  Last saw Dr. Radford Pax 09/2018 and doing well. HR regular on exam.   Patient comes in for f/u. Main complaint is bilateral hip pain which is frusting after hip replacements. Exercising in the pool 3 days/week. BP high today-ate a can of soup last night. Gained 10 lbs since covid19. Knows he has to lose weight. Tried weight watchers but not interested. Very stressed driving to get here. Wants to switched to [facility] cardiologist.    Past Medical History:  Diagnosis Date  . Arthritis of both knees   . BPH (benign prostatic hyperplasia)   . Hypertension   . Obesity (BMI 30-39.9) 04/08/2016  . OSA (obstructive sleep apnea) 03/30/2017   Moderate with AHI 21.3/hr with nocturnal hypoxemia by sleep study  . Persistent atrial fibrillation    s/p TEE/DCCV.  He is on Xarelto for a CHADS2VASC score 2    Past Surgical History:  Procedure Laterality Date  . CARDIOVERSION N/A 12/09/2013   Procedure: CARDIOVERSION;  Surgeon: Larey Dresser, MD;  Location: [facility] ENDOSCOPY;  Service: Cardiovascular;  Laterality: N/A;  . CARDIOVERSION N/A 05/14/2015   Procedure: CARDIOVERSION;  Surgeon: Pixie Casino, MD;  Location: [facility] ENDOSCOPY;  Service: Cardiovascular;  Laterality: N/A;  . CARDIOVERSION N/A 02/16/2018   Procedure: CARDIOVERSION;  Surgeon: Pixie Casino, MD;  Location: [place];  Service: Cardiovascular;  Laterality: N/A;  . CERVICAL SPINE SURGERY     . TEE WITHOUT CARDIOVERSION N/A 12/09/2013   Procedure: TRANSESOPHAGEAL ECHOCARDIOGRAM (TEE);  Surgeon: Larey Dresser, MD;  Location: [facility] ENDOSCOPY;  Service: Cardiovascular;  Laterality: N/A;  . TEE WITHOUT CARDIOVERSION N/A 05/14/2015   Procedure: TRANSESOPHAGEAL ECHOCARDIOGRAM (TEE);  Surgeon: Pixie Casino, MD;  Location: [place];  Service: Cardiovascular;  Laterality: N/A;  . TEE WITHOUT CARDIOVERSION N/A 02/16/2018   Procedure: TRANSESOPHAGEAL ECHOCARDIOGRAM (TEE);  Surgeon: Pixie Casino, MD;  Location: [facility] ENDOSCOPY;  Service: Cardiovascular;  Laterality: N/A;    Current Medications: Current Meds  Medication Sig  . albuterol (PROAIR HFA) 108 (90 Base) MCG/ACT inhaler Inhale 2 puffs into the lungs every 4 (four) hours as needed for wheezing or shortness of breath.   . calcium-vitamin D (OSCAL WITH D) 500-200 MG-UNIT per tablet Take 1 tablet by mouth daily.   . Coenzyme Q10 (COQ10) 100 MG CAPS Take 100 mg by mouth 2 (two) times daily.   Marland Kitchen diltiazem (TIAZAC) 240 MG 24 hr capsule Take 240 mg by mouth daily.  . finasteride (PROSCAR) 5 MG tablet Take 5 mg by mouth daily.  . flecainide (TAMBOCOR) 100 MG tablet Take 1 tablet (100 mg total) by mouth every 12 (twelve) hours.  Javier Docker Oil 500 MG CAPS Take 500 mg by mouth daily.   Marland Kitchen losartan (COZAAR) 100 MG tablet Take 1 tablet (100 mg total) by mouth daily.  . Magnesium 400 MG TABS Take 400 mg by mouth daily.   Marland Kitchen  Multiple Vitamin (MULTIVITAMIN WITH MINERALS) TABS tablet Take 2 tablets by mouth daily.   Marland Kitchen OVER THE COUNTER MEDICATION Take 1 capsule by mouth 2 (two) times daily. Livaplex Supplement  . Potassium Citrate 15 MEQ (1620 MG) TBCR Take 15 mEq by mouth 2 (two) times daily.  . rivaroxaban (XARELTO) 20 MG TABS tablet Take 1 tablet (20 mg total) by mouth daily with supper.  . Tragacanth (ASTRAGALUS ROOT) POWD Take 1 capsule by mouth 2 (two) times daily.      Allergies:   Benazepril, Codeine, Penicillins, and Prednisone    Social History   Socioeconomic History  . Marital status: Married    Spouse name: Not on file  . Number of children: Not on file  . Years of education: Not on file  . Highest education level: Not on file  Occupational History  . Not on file  Social Needs  . Financial resource strain: Not on file  . Food insecurity    Worry: Not on file    Inability: Not on file  . Transportation needs    Medical: Not on file    Non-medical: Not on file  Tobacco Use  . Smoking status: Former Research scientist (life sciences)  . Smokeless tobacco: Never Used  Substance and Sexual Activity  . Alcohol use: Yes    Alcohol/week: 28.0 standard drinks    Types: 7 Glasses of wine, 21 Cans of beer per week  . Drug use: No  . Sexual activity: Not on file  Lifestyle  . Physical activity    Days per week: Not on file    Minutes per session: Not on file  . Stress: Not on file  Relationships  . Social Herbalist on phone: Not on file    Gets together: Not on file    Attends religious service: Not on file    Active member of club or organization: Not on file    Attends meetings of clubs or organizations: Not on file    Relationship status: Not on file  Other Topics Concern  . Not on file  Social History Narrative  . Not on file     Family History:  The patient's family history includes Cancer in his father and mother.   ROS:   Please see the history of present illness.    ROS All other systems reviewed and are negative.   PHYSICAL EXAM:   VS:  BP (!) 154/68   Pulse 67   Ht 5\' 8"  (1.727 m)   Wt 220 lb 1.9 oz (99.8 kg)   BMI 33.47 kg/m   Physical Exam  GEN: Obese, in no acute distress  Neck: no JVD, carotid bruits, or masses Cardiac:RRR; no murmurs, rubs, or gallops  Respiratory:  clear to auscultation bilaterally, normal work of breathing GI: soft, nontender, nondistended, + BS Ext: trace edema otherwise without cyanosis, clubbing,  Good distal pulses bilaterally Neuro:  Alert and Oriented x 3,   Psych: euthymic mood, full affect  Wt Readings from Last 3 Encounters:  03/15/19 220 lb 1.9 oz (99.8 kg)  09/16/18 206 lb 12.8 oz (93.8 kg)  05/26/18 212 lb (96.2 kg)      Studies/Labs Reviewed:   EKG:  EKG is  ordered today.  The ekg ordered today demonstrates NSR with IRBBB no acute change  Recent Labs: 09/16/2018: BUN 20; Creatinine, Ser 0.91; Hemoglobin 13.4; Platelets 326; Potassium 4.5; Sodium 139   Lipid Panel No results found for: CHOL, TRIG, HDL, CHOLHDL, VLDL, LDLCALC,  LDLDIRECT  Additional studies/ records that were reviewed today include:  TEE 02/16/2018 Study Conclusions   - Left ventricle: The cavity size was normal. There was mild   concentric hypertrophy. Systolic function was normal. The   estimated ejection fraction was in the range of 60% to 65%. Wall   motion was normal; there were no regional wall motion   abnormalities. - Aortic valve: No evidence of vegetation. - Aorta: Mild atheromatous disease. 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   Mild, anteriorly directed regurgitation. - Left atrium: Moderately dilated. No evidence of thrombus in the   atrial cavity or appendage. - Right atrium: The atrium was dilated. - Atrial septum: No defect or patent foramen ovale was identified. - Pulmonic valve: No evidence of vegetation.   Impressions:   - No cardiac source of emboli was indentified. Successful DCCV to   NSR.       ASSESSMENT:    1. PAF (paroxysmal atrial fibrillation) ([place])   2. Essential hypertension   3. OSA (obstructive sleep apnea)   4. Obesity (BMI 30-39.9)      PLAN:  In order of problems listed above:  PAF on flecainide and Xarelto TEE guided cardioversion 02/16/2018 in NSR today. Continue Xarelto, tambocor. Wants to f/u in [facility] because of the drive-will arrange.  Essential HTN BP up today but usually 135-140/60 at home. Getting extra salt in diet. 2 gm sodium diet and weight loss recommended  OSA  on BIPAP- compliant followed by Dr. Radford Pax  Obesity exercise and weight loss.    Medication Adjustments/Labs and Tests Ordered: Current medicines are reviewed at length with the patient today.  Concerns regarding medicines are outlined above.  Medication changes, Labs and Tests ordered today are listed in the Patient Instructions below. There are no Patient Instructions on file for this visit.   Sumner Boast, PA-C  03/15/2019 11:27 AM    [facility] [place], [place], [place]  [address] Phone: [phone]; Fax: [phone]

## 2019-03-15 ENCOUNTER — Other Ambulatory Visit: Payer: Self-pay

## 2019-03-15 ENCOUNTER — Encounter: Payer: Self-pay | Admitting: Physician Assistant

## 2019-03-15 ENCOUNTER — Ambulatory Visit (INDEPENDENT_AMBULATORY_CARE_PROVIDER_SITE_OTHER): Payer: Medicare HMO | Admitting: Physician Assistant

## 2019-03-15 ENCOUNTER — Ambulatory Visit: Payer: Medicare HMO | Admitting: Cardiology

## 2019-03-15 VITALS — BP 154/68 | HR 67 | Ht 68.0 in | Wt 220.1 lb

## 2019-03-15 DIAGNOSIS — E669 Obesity, unspecified: Secondary | ICD-10-CM | POA: Diagnosis not present

## 2019-03-15 DIAGNOSIS — I480 Paroxysmal atrial fibrillation: Secondary | ICD-10-CM

## 2019-03-15 DIAGNOSIS — I10 Essential (primary) hypertension: Secondary | ICD-10-CM | POA: Diagnosis not present

## 2019-03-15 DIAGNOSIS — G4733 Obstructive sleep apnea (adult) (pediatric): Secondary | ICD-10-CM | POA: Diagnosis not present

## 2019-03-15 NOTE — Patient Instructions (Signed)
Medication Instructions:  Your physician recommends that you continue on your current medications as directed. Please refer to the Current Medication list given to you today.  If you need a refill on your cardiac medications before your next appointment, please call your pharmacy.   Lab work: TODAY: BMET, CBC  If you have labs (blood work) drawn today and your tests are completely normal, you will receive your results only by: Marland Kitchen MyChart Message (if you have MyChart) OR . A paper copy in the mail If you have any lab test that is abnormal or we need to change your treatment, we will call you to review the results.  Testing/Procedures: None ordered  Follow-Up: . Follow up with one of the [facility] in the [facility] in 6 months for general cardiology concerns . Follow up with Dr. Radford Pax for Sleep in March 2021  Any Other Special Instructions Will Be Listed Below (If Applicable).

## 2019-03-16 LAB — CBC
Hematocrit: 39.1 % (ref 37.5–51.0)
Hemoglobin: 13.1 g/dL (ref 13.0–17.7)
MCH: 32.3 pg (ref 26.6–33.0)
MCHC: 33.5 g/dL (ref 31.5–35.7)
MCV: 96 fL (ref 79–97)
Platelets: 294 10*3/uL (ref 150–450)
RBC: 4.06 x10E6/uL — ABNORMAL LOW (ref 4.14–5.80)
RDW: 12.7 % (ref 11.6–15.4)
WBC: 9.3 10*3/uL (ref 3.4–10.8)

## 2019-03-16 LAB — BASIC METABOLIC PANEL
BUN/Creatinine Ratio: 20 (ref 10–24)
BUN: 18 mg/dL (ref 8–27)
CO2: 26 mmol/L (ref 20–29)
Calcium: 9.4 mg/dL (ref 8.6–10.2)
Chloride: 100 mmol/L (ref 96–106)
Creatinine, Ser: 0.89 mg/dL (ref 0.76–1.27)
GFR calc Af Amer: 98 mL/min/{1.73_m2} (ref >59)
GFR calc non Af Amer: 85 mL/min/{1.73_m2} (ref >59)
Glucose: 109 mg/dL — ABNORMAL HIGH (ref 65–99)
Potassium: 4.9 mmol/L (ref 3.5–5.2)
Sodium: 141 mmol/L (ref 134–144)

## 2019-05-13 ENCOUNTER — Other Ambulatory Visit: Payer: Self-pay | Admitting: Cardiology

## 2019-05-13 MED ORDER — HYDROCHLOROTHIAZIDE 25 MG PO TABS: 25.0000 mg | ORAL_TABLET | Freq: Every day | ORAL | 3 refills | Status: DC

## 2019-05-13 MED ORDER — LOSARTAN POTASSIUM 100 MG PO TABS: 100.0000 mg | ORAL_TABLET | Freq: Every day | ORAL | 3 refills | Status: DC

## 2019-05-13 NOTE — Telephone Encounter (Signed)
° ° ° °  Patient has no medication remaining   1. Which medications need to be refilled? (please list name of each medication and dose if known)  hydrochlorothiazide (HYDRODIURIL) 25 MG tablet losartan (COZAAR) 100 MG tablet  2. Which pharmacy/location (including street and city if local pharmacy) is medication to be sent to?[id number] - [place], [place] - [address]  3. Do they need a 30 day or 90 day supply? [place]

## 2019-09-16 ENCOUNTER — Other Ambulatory Visit: Payer: Self-pay

## 2019-09-16 ENCOUNTER — Ambulatory Visit: Payer: Medicare HMO | Admitting: Cardiology

## 2019-09-16 ENCOUNTER — Encounter: Payer: Self-pay | Admitting: Cardiology

## 2019-09-16 VITALS — BP 126/60 | HR 65 | Ht 68.0 in | Wt 221.8 lb

## 2019-09-16 DIAGNOSIS — E669 Obesity, unspecified: Secondary | ICD-10-CM

## 2019-09-16 DIAGNOSIS — I4819 Other persistent atrial fibrillation: Secondary | ICD-10-CM | POA: Diagnosis not present

## 2019-09-16 DIAGNOSIS — I10 Essential (primary) hypertension: Secondary | ICD-10-CM

## 2019-09-16 DIAGNOSIS — G4733 Obstructive sleep apnea (adult) (pediatric): Secondary | ICD-10-CM

## 2019-09-16 NOTE — Progress Notes (Signed)
Cardiology Office Note:    Date:  09/16/2019   ID:  Corey Hood, DOB [birth year], MRN [place]:[id number]  PCP:  Virgel Paling, MD  Cardiologist:  [name], MD    Referring MD: Thomes Dinning, MD   Chief Complaint  Patient presents with  . Sleep Apnea  . Hypertension  . Atrial Fibrillation    History of Present Illness:    Corey Hood is a 74 y.o. male with a hx of persistentAF s/p TEE/DCCV 04/2015,HTNand moderate OSA with AHI 21.3/hr with nocturnal hypoxemia and oxygen desaturations as low as 70%.  He did not tolerate CPAP due to ongoing respiratory events and underwent BiPAP titration to 16/12cm H2O.    He is here today for followup and is doing well.  He denies any chest pain or pressure, SOB, DOE, PND, orthopnea, dizziness, palpitations or syncope. Occasionally he will have some LE edema. He is compliant with his meds and is tolerating meds with no SE.  He is doing well with his BiPAP device and thinks that he has gotten used to it.  He tolerates the mask and feels the pressure is adequate.  Since going on BiPAP he feels rested in the am and has no significant daytime sleepiness.  He denies any significant mouth or nasal dryness or nasal congestion.  He does not think that he snores.     Past Medical History:  Diagnosis Date  . Arthritis of both knees   . BPH (benign prostatic hyperplasia)   . Hypertension   . Obesity (BMI 30-39.9) 04/08/2016  . OSA (obstructive sleep apnea) 03/30/2017   Moderate with AHI 21.3/hr with nocturnal hypoxemia by sleep study  . Persistent atrial fibrillation (HCC)    s/p TEE/DCCV.  He is on Xarelto for a CHADS2VASC score 2    Past Surgical History:  Procedure Laterality Date  . CARDIOVERSION N/A 12/09/2013   Procedure: CARDIOVERSION;  Surgeon: Larey Dresser, MD;  Location: [facility] ENDOSCOPY;  Service: Cardiovascular;  Laterality: N/A;  . CARDIOVERSION N/A 05/14/2015   Procedure: CARDIOVERSION;  Surgeon: Pixie Casino, MD;  Location: [facility]  ENDOSCOPY;  Service: Cardiovascular;  Laterality: N/A;  . CARDIOVERSION N/A 02/16/2018   Procedure: CARDIOVERSION;  Surgeon: Pixie Casino, MD;  Location: [place];  Service: Cardiovascular;  Laterality: N/A;  . CERVICAL SPINE SURGERY    . TEE WITHOUT CARDIOVERSION N/A 12/09/2013   Procedure: TRANSESOPHAGEAL ECHOCARDIOGRAM (TEE);  Surgeon: Larey Dresser, MD;  Location: [facility] ENDOSCOPY;  Service: Cardiovascular;  Laterality: N/A;  . TEE WITHOUT CARDIOVERSION N/A 05/14/2015   Procedure: TRANSESOPHAGEAL ECHOCARDIOGRAM (TEE);  Surgeon: Pixie Casino, MD;  Location: [place];  Service: Cardiovascular;  Laterality: N/A;  . TEE WITHOUT CARDIOVERSION N/A 02/16/2018   Procedure: TRANSESOPHAGEAL ECHOCARDIOGRAM (TEE);  Surgeon: Pixie Casino, MD;  Location: [facility] ENDOSCOPY;  Service: Cardiovascular;  Laterality: N/A;    Current Medications: Current Meds  Medication Sig  . calcium-vitamin D (OSCAL WITH D) 500-200 MG-UNIT per tablet Take 1 tablet by mouth daily.   . Coenzyme Q10 (COQ10) 100 MG CAPS Take 100 mg by mouth 2 (two) times daily.   Marland Kitchen diltiazem (TIAZAC) 240 MG 24 hr capsule Take 240 mg by mouth daily.  . finasteride (PROSCAR) 5 MG tablet Take 5 mg by mouth daily.  . flecainide (TAMBOCOR) 100 MG tablet Take 1 tablet (100 mg total) by mouth every 12 (twelve) hours.  . hydrochlorothiazide (HYDRODIURIL) 25 MG tablet Take 1 tablet (25 mg total) by mouth daily.  Marland Kitchen HYDROcodone-acetaminophen (NORCO/VICODIN)  5-325 MG tablet Take 1 tablet by mouth every 6 (six) hours as needed.   Javier Docker Oil 500 MG CAPS Take 500 mg by mouth daily.   Marland Kitchen losartan (COZAAR) 100 MG tablet Take 1 tablet (100 mg total) by mouth daily.  . Magnesium 400 MG TABS Take 400 mg by mouth daily.   . Multiple Vitamin (MULTIVITAMIN WITH MINERALS) TABS tablet Take 2 tablets by mouth daily.   Marland Kitchen OVER THE COUNTER MEDICATION Take 1 capsule by mouth 2 (two) times daily. Livaplex Supplement  . Potassium Citrate 15 MEQ (1620 MG) TBCR Take  15 mEq by mouth 2 (two) times daily.  . rivaroxaban (XARELTO) 20 MG TABS tablet Take 1 tablet (20 mg total) by mouth daily with supper.  . Tragacanth (ASTRAGALUS ROOT) POWD Take 1 capsule by mouth 2 (two) times daily.   Marland Kitchen triamcinolone cream (KENALOG) 0.1 %   . zinc gluconate 50 MG tablet Take 50 mg by mouth daily.     Allergies:   Penicillins, Benazepril, Codeine, and Prednisone   Social History   Socioeconomic History  . Marital status: Married    Spouse name: Not on file  . Number of children: Not on file  . Years of education: Not on file  . Highest education level: Not on file  Occupational History  . Not on file  Tobacco Use  . Smoking status: Former Research scientist (life sciences)  . Smokeless tobacco: Never Used  Substance and Sexual Activity  . Alcohol use: Yes    Alcohol/week: 28.0 standard drinks    Types: 7 Glasses of wine, 21 Cans of beer per week  . Drug use: No  . Sexual activity: Not on file  Other Topics Concern  . Not on file  Social History Narrative  . Not on file   Social Determinants of Health   Financial Resource Strain:   . Difficulty of Paying Living Expenses: Not on file  Food Insecurity:   . Worried About Charity fundraiser in the Last Year: Not on file  . Ran Out of Food in the Last Year: Not on file  Transportation Needs:   . Lack of Transportation (Medical): Not on file  . Lack of Transportation (Non-Medical): Not on file  Physical Activity:   . Days of Exercise per Week: Not on file  . Minutes of Exercise per Session: Not on file  Stress:   . Feeling of Stress : Not on file  Social Connections:   . Frequency of Communication with Friends and Family: Not on file  . Frequency of Social Gatherings with Friends and Family: Not on file  . Attends Religious Services: Not on file  . Active Member of Clubs or Organizations: Not on file  . Attends Archivist Meetings: Not on file  . Marital Status: Not on file     Family History: The patient's family  history includes Cancer in his father and mother.  ROS:   Please see the history of present illness.    ROS  All other systems reviewed and negative.   EKGs/Labs/Other Studies Reviewed:    The following studies were reviewed today: PAp compliance download on Airview, EKG, outside labs from PAP  EKG:  EKG is not ordered today.    Recent Labs: 03/15/2019: BUN 18; Creatinine, Ser 0.89; Hemoglobin 13.1; Platelets 294; Potassium 4.9; Sodium 141   Recent Lipid Panel No results found for: CHOL, TRIG, HDL, CHOLHDL, VLDL, LDLCALC, LDLDIRECT  Physical Exam:    VS:  BP  126/60   Pulse 65   Ht 5\' 8"  (1.727 m)   Wt 221 lb 12.8 oz (100.6 kg)   SpO2 98%   BMI 33.72 kg/m     Wt Readings from Last 3 Encounters:  09/16/19 221 lb 12.8 oz (100.6 kg)  03/15/19 220 lb 1.9 oz (99.8 kg)  09/16/18 206 lb 12.8 oz (93.8 kg)     GEN:  Well nourished, well developed in no acute distress HEENT: Normal NECK: No JVD; No carotid bruits LYMPHATICS: No lymphadenopathy CARDIAC: RRR, no murmurs, rubs, gallops RESPIRATORY:  Clear to auscultation without rales, wheezing or rhonchi  ABDOMEN: Soft, non-tender, non-distended MUSCULOSKELETAL:  No edema; No deformity  SKIN: Warm and dry NEUROLOGIC:  Alert and oriented x 3 PSYCHIATRIC:  Normal affect   ASSESSMENT:    1. OSA (obstructive sleep apnea)   2. Essential hypertension   3. Obesity (BMI 30-39.9)   4. Persistent atrial fibrillation (HCC)    PLAN:    In order of problems listed above:  1.  OSA -  The patient is tolerating PAP therapy well without any problems.I will get a download from the DME.   The patient has been using and benefiting from PAP use and will continue to benefit from therapy.   2. HTN -BP controlled -continue Diltiazem 240mg  daily, HCTZ 25mg  daily and Losartan 100mg  daily -I will repeat BMET  3.  Obesity -I have encouraged him to get into a routine exercise program and cut back on carbs and portions.   4.  PAF -he is  maintaining NSR on exam -continue Diltiazem 240mg  daily, flecainide 100mg  BID, Xarelto 20mg  daily -Hbg was 13 and Creatinine was 0.89 on outside labs reviewed on [facility] in Sept.  -no bleeding problems on DOAC -Check BMET and CBC   Medication Adjustments/Labs and Tests Ordered: Current medicines are reviewed at length with the patient today.  Concerns regarding medicines are outlined above.  No orders of the defined types were placed in this encounter.  No orders of the defined types were placed in this encounter.   Signed, [name], MD  09/16/2019 11:11 AM    [place]

## 2019-09-16 NOTE — Patient Instructions (Signed)
Medication Instructions:  Your physician recommends that you continue on your current medications as directed. Please refer to the Current Medication list given to you today.  *If you need a refill on your cardiac medications before your next appointment, please call your pharmacy*   Lab Work: TODAY: BMET, CBC If you have labs (blood work) drawn today and your tests are completely normal, you will receive your results only by: Marland Kitchen MyChart Message (if you have MyChart) OR . A paper copy in the mail If you have any lab test that is abnormal or we need to change your treatment, we will call you to review the results.  Follow-Up: At [facility], you and your health needs are our priority.  As part of our continuing mission to provide you with exceptional heart care, we have created designated Provider Care Teams.  These Care Teams include your primary Cardiologist (physician) and Advanced Practice Providers (APPs -  Physician Assistants and Nurse Practitioners) who all work together to provide you with the care you need, when you need it.  We recommend signing up for the patient portal called "MyChart".  Sign up information is provided on this After Visit Summary.  MyChart is used to connect with patients for Virtual Visits (Telemedicine).  Patients are able to view lab/test results, encounter notes, upcoming appointments, etc.  Non-urgent messages can be sent to your provider as well.   To learn more about what you can do with MyChart, go to [email or web address].    Your next appointment:   6 month(s)  The format for your next appointment:   In Person  Provider:   [name], MD

## 2019-09-17 LAB — BASIC METABOLIC PANEL
BUN/Creatinine Ratio: 20 (ref 10–24)
BUN: 18 mg/dL (ref 8–27)
CO2: 26 mmol/L (ref 20–29)
Calcium: 9.3 mg/dL (ref 8.6–10.2)
Chloride: 102 mmol/L (ref 96–106)
Creatinine, Ser: 0.88 mg/dL (ref 0.76–1.27)
GFR calc Af Amer: 99 mL/min/{1.73_m2} (ref >59)
GFR calc non Af Amer: 85 mL/min/{1.73_m2} (ref >59)
Glucose: 92 mg/dL (ref 65–99)
Potassium: 4.6 mmol/L (ref 3.5–5.2)
Sodium: 142 mmol/L (ref 134–144)

## 2019-09-17 LAB — CBC
Hematocrit: 41.5 % (ref 37.5–51.0)
Hemoglobin: 14 g/dL (ref 13.0–17.7)
MCH: 31.5 pg (ref 26.6–33.0)
MCHC: 33.7 g/dL (ref 31.5–35.7)
MCV: 93 fL (ref 79–97)
Platelets: 309 10*3/uL (ref 150–450)
RBC: 4.45 x10E6/uL (ref 4.14–5.80)
RDW: 12.7 % (ref 11.6–15.4)
WBC: 9.1 10*3/uL (ref 3.4–10.8)

## 2019-10-07 ENCOUNTER — Telehealth: Payer: Self-pay | Admitting: *Deleted

## 2019-10-07 NOTE — Telephone Encounter (Signed)
-----   Message from Sueanne Margarita, MD sent at 10/07/2019 11:04 AM EDT ----- Good AHI and compliance.  Continue current PAP settings.

## 2019-10-07 NOTE — Telephone Encounter (Signed)
Informed patient of compliance results and verbalized understanding was indicated. Patient is aware and agreeable to AHI being within range at 0.8. Patient is aware and agreeable to being in compliance with machine usage. Patient is aware and agreeable to no change in current pressures. 

## 2020-03-27 NOTE — Progress Notes (Signed)
Cardiology Office Note:    Date:  03/29/2020   ID:  Corey Hood, DOB [birth year], MRN [id number]  PCP:  Virgel Paling, MD  Cardiologist:  [name], MD    Referring MD: Virgel Paling, MD   Chief Complaint  Patient presents with  . Sleep Apnea  . Hypertension  . Atrial Fibrillation    History of Present Illness:    Corey Hood is a 74 y.o. male with a hx of persistentAF s/p TEE/DCCV 04/2015,HTNand moderate OSA with AHI 21.3/hr with nocturnal hypoxemia and oxygen desaturations as low as 70%.  He did not tolerate CPAP due to ongoing respiratory events and underwent BiPAP titration to 16/12cm H2O.    He is here today for followup and is doing well.  He denies any chest pain or pressure, SOB, DOE, PND, orthopnea, LE edema, dizziness, palpitations or syncope. He is compliant with his meds and is tolerating meds with no SE.  He is doing well with his CPAP device and thinks that he has gotten used to it.  He tolerates the mask and feels the pressure is adequate.  Since going on CPAP he feels rested in the am and has no significant daytime sleepiness.  He denies any significant mouth or nasal dryness or nasal congestion.  He does not think that he snores.     Past Medical History:  Diagnosis Date  . Arthritis of both knees   . BPH (benign prostatic hyperplasia)   . Hypertension   . Obesity (BMI 30-39.9) 04/08/2016  . OSA (obstructive sleep apnea) 03/30/2017   Moderate with AHI 21.3/hr with nocturnal hypoxemia by sleep study  . Persistent atrial fibrillation (HCC)    s/p TEE/DCCV.  He is on Xarelto for a CHADS2VASC score 2    Past Surgical History:  Procedure Laterality Date  . CARDIOVERSION N/A 12/09/2013   Procedure: CARDIOVERSION;  Surgeon: Larey Dresser, MD;  Location: [facility] ENDOSCOPY;  Service: Cardiovascular;  Laterality: N/A;  . CARDIOVERSION N/A 05/14/2015   Procedure: CARDIOVERSION;  Surgeon: Pixie Casino, MD;  Location: [facility] ENDOSCOPY;  Service: Cardiovascular;   Laterality: N/A;  . CARDIOVERSION N/A 02/16/2018   Procedure: CARDIOVERSION;  Surgeon: Pixie Casino, MD;  Location: [place];  Service: Cardiovascular;  Laterality: N/A;  . CERVICAL SPINE SURGERY    . TEE WITHOUT CARDIOVERSION N/A 12/09/2013   Procedure: TRANSESOPHAGEAL ECHOCARDIOGRAM (TEE);  Surgeon: Larey Dresser, MD;  Location: [facility] ENDOSCOPY;  Service: Cardiovascular;  Laterality: N/A;  . TEE WITHOUT CARDIOVERSION N/A 05/14/2015   Procedure: TRANSESOPHAGEAL ECHOCARDIOGRAM (TEE);  Surgeon: Pixie Casino, MD;  Location: [place];  Service: Cardiovascular;  Laterality: N/A;  . TEE WITHOUT CARDIOVERSION N/A 02/16/2018   Procedure: TRANSESOPHAGEAL ECHOCARDIOGRAM (TEE);  Surgeon: Pixie Casino, MD;  Location: [facility] ENDOSCOPY;  Service: Cardiovascular;  Laterality: N/A;    Current Medications: Current Meds  Medication Sig  . Coenzyme Q10 (COQ10) 100 MG CAPS Take 100 mg by mouth 2 (two) times daily.   Marland Kitchen diltiazem (TIAZAC) 240 MG 24 hr capsule Take 240 mg by mouth daily.  . finasteride (PROSCAR) 5 MG tablet Take 5 mg by mouth daily.  . flecainide (TAMBOCOR) 100 MG tablet Take 1 tablet (100 mg total) by mouth every 12 (twelve) hours.  . hydrochlorothiazide (HYDRODIURIL) 25 MG tablet Take 1 tablet (25 mg total) by mouth daily.  Javier Docker Oil 500 MG CAPS Take 500 mg by mouth daily.   Marland Kitchen losartan (COZAAR) 100 MG tablet Take 1 tablet (100 mg total)  by mouth daily.  . Magnesium 400 MG TABS Take 400 mg by mouth daily.   . Multiple Vitamin (MULTIVITAMIN WITH MINERALS) TABS tablet Take 2 tablets by mouth daily.   Marland Kitchen OVER THE COUNTER MEDICATION Take 1 capsule by mouth 2 (two) times daily. Livaplex Supplement  . Potassium Citrate 15 MEQ (1620 MG) TBCR Take 15 mEq by mouth 2 (two) times daily.  . rivaroxaban (XARELTO) 20 MG TABS tablet Take 1 tablet (20 mg total) by mouth daily with supper.  . Tragacanth (ASTRAGALUS ROOT) POWD Take 1 capsule by mouth 2 (two) times daily.   Marland Kitchen triamcinolone cream  (KENALOG) 0.1 % Apply 1 application topically daily as needed.   . zinc gluconate 50 MG tablet Take 50 mg by mouth daily.     Allergies:   Penicillins, Benazepril, Codeine, and Prednisone   Social History   Socioeconomic History  . Marital status: Married    Spouse name: Not on file  . Number of children: Not on file  . Years of education: Not on file  . Highest education level: Not on file  Occupational History  . Not on file  Tobacco Use  . Smoking status: Former Research scientist (life sciences)  . Smokeless tobacco: Never Used  Substance and Sexual Activity  . Alcohol use: Yes    Alcohol/week: 28.0 standard drinks    Types: 7 Glasses of wine, 21 Cans of beer per week  . Drug use: No  . Sexual activity: Not on file  Other Topics Concern  . Not on file  Social History Narrative  . Not on file   Social Determinants of Health   Financial Resource Strain:   . Difficulty of Paying Living Expenses: Not on file  Food Insecurity:   . Worried About Charity fundraiser in the Last Year: Not on file  . Ran Out of Food in the Last Year: Not on file  Transportation Needs:   . Lack of Transportation (Medical): Not on file  . Lack of Transportation (Non-Medical): Not on file  Physical Activity:   . Days of Exercise per Week: Not on file  . Minutes of Exercise per Session: Not on file  Stress:   . Feeling of Stress : Not on file  Social Connections:   . Frequency of Communication with Friends and Family: Not on file  . Frequency of Social Gatherings with Friends and Family: Not on file  . Attends Religious Services: Not on file  . Active Member of Clubs or Organizations: Not on file  . Attends Archivist Meetings: Not on file  . Marital Status: Not on file     Family History: The patient's family history includes Cancer in his father and mother.  ROS:   Please see the history of present illness.    ROS  All other systems reviewed and negative.   EKGs/Labs/Other Studies Reviewed:     The following studies were reviewed today: PAp compliance download on Airview, EKG, outside labs from PAP  EKG:  EKG is not ordered today.    Recent Labs: 09/16/2019: BUN 18; Creatinine, Ser 0.88; Hemoglobin 14.0; Platelets 309; Potassium 4.6; Sodium 142   Recent Lipid Panel No results found for: CHOL, TRIG, HDL, CHOLHDL, VLDL, LDLCALC, LDLDIRECT  Physical Exam:    VS:  BP 134/70   Pulse 70   Ht 5\' 8"  (1.727 m)   Wt 222 lb (100.7 kg)   BMI 33.75 kg/m     Wt Readings from Last 3 Encounters:  03/29/20 222 lb (100.7 kg)  09/16/19 221 lb 12.8 oz (100.6 kg)  03/15/19 220 lb 1.9 oz (99.8 kg)     GEN: Well nourished, well developed in no acute distress HEENT: Normal NECK: No JVD; No carotid bruits LYMPHATICS: No lymphadenopathy CARDIAC:RRR, no murmurs, rubs, gallops RESPIRATORY:  Clear to auscultation without rales, wheezing or rhonchi  ABDOMEN: Soft, non-tender, non-distended MUSCULOSKELETAL:  No edema; No deformity  SKIN: Warm and dry NEUROLOGIC:  Alert and oriented x 3 PSYCHIATRIC:  Normal affect    EKG was done today and showed NSR with no ST changes  ASSESSMENT:    1. OSA (obstructive sleep apnea)   2. Essential hypertension   3. Obesity (BMI 30-39.9)   4. PAF (paroxysmal atrial fibrillation) (HCC)    PLAN:    In order of problems listed above:  1.  OSA - The patient is tolerating PAP therapy well without any problems.  The patient has been using and benefiting from PAP use and will continue to benefit from therapy.  -I will get a copy of his last download from DME  2. HTN -BP is controlled -continue Diltiazem 240mg  daily, HCTZ 25mg  daily and Losartan 100mg  daily -his BP sporadically is elevated at home>>he salts his food at home -I have recommended a < 2gm Na diet  -check BMET  3.  Obesity -I have encouraged him to cut back on carbs and portions.  -his exercise is limited by knee and hip pain -he exercises in the pool  4.  PAF -he is maintaining  NSR -continue Diltiazem 240mg  daily, flecainide 100mg  BID, Xarelto 20mg  daily -no bleeding problems on DOAC -check BMET and CBC   Medication Adjustments/Labs and Tests Ordered: Current medicines are reviewed at length with the patient today.  Concerns regarding medicines are outlined above.  Orders Placed This Encounter  Procedures  . EKG 12-Lead   No orders of the defined types were placed in this encounter.   Signed, [name], MD  03/29/2020 11:23 AM    [place]

## 2020-03-29 ENCOUNTER — Ambulatory Visit: Payer: Medicare HMO | Admitting: Cardiology

## 2020-03-29 ENCOUNTER — Encounter: Payer: Self-pay | Admitting: Cardiology

## 2020-03-29 ENCOUNTER — Other Ambulatory Visit: Payer: Self-pay

## 2020-03-29 VITALS — BP 134/70 | HR 70 | Ht 68.0 in | Wt 222.0 lb

## 2020-03-29 DIAGNOSIS — G4733 Obstructive sleep apnea (adult) (pediatric): Secondary | ICD-10-CM

## 2020-03-29 DIAGNOSIS — I480 Paroxysmal atrial fibrillation: Secondary | ICD-10-CM | POA: Diagnosis not present

## 2020-03-29 DIAGNOSIS — I10 Essential (primary) hypertension: Secondary | ICD-10-CM

## 2020-03-29 DIAGNOSIS — E669 Obesity, unspecified: Secondary | ICD-10-CM | POA: Diagnosis not present

## 2020-03-29 LAB — CBC
Hematocrit: 38.6 % (ref 37.5–51.0)
Hemoglobin: 13.2 g/dL (ref 13.0–17.7)
MCH: 31.5 pg (ref 26.6–33.0)
MCHC: 34.2 g/dL (ref 31.5–35.7)
MCV: 92 fL (ref 79–97)
Platelets: 258 10*3/uL (ref 150–450)
RBC: 4.19 x10E6/uL (ref 4.14–5.80)
RDW: 13.3 % (ref 11.6–15.4)
WBC: 7.5 10*3/uL (ref 3.4–10.8)

## 2020-03-29 NOTE — Addendum Note (Signed)
Addended by: Antonieta Iba on: 03/29/2020 11:35 AM   Modules accepted: Orders

## 2020-03-29 NOTE — Patient Instructions (Addendum)
Medication Instructions:  Your physician recommends that you continue on your current medications as directed. Please refer to the Current Medication list given to you today.  *If you need a refill on your cardiac medications before your next appointment, please call your pharmacy*  Lab Work: TODAY: CBC  If you have labs (blood work) drawn today and your tests are completely normal, you will receive your results only by: [place] MyChart Message (if you have MyChart) OR . A paper copy in the mail If you have any lab test that is abnormal or we need to change your treatment, we will call you to review the results.  Follow-Up: At [facility], you and your health needs are our priority.  As part of our continuing mission to provide you with exceptional heart care, we have created designated Provider Care Teams.  These Care Teams include your primary Cardiologist (physician) and Advanced Practice Providers (APPs -  Physician Assistants and Nurse Practitioners) who all work together to provide you with the care you need, when you need it.  Your next appointment:   1 year(s)  The format for your next appointment:   In Person  Provider:   You may see [name], MD or one of the following Advanced Practice Providers on your designated Care Team:    Melina Copa, PA-C  Ermalinda Barrios, PA-C    Other Instructions  Low-Sodium Eating Plan Sodium, which is an element that makes up salt, helps you maintain a healthy balance of fluids in your body. Too much sodium can increase your blood pressure and cause fluid and waste to be held in your body. Your health care provider or dietitian may recommend following this plan if you have high blood pressure (hypertension), kidney disease, liver disease, or heart failure. Eating less sodium can help lower your blood pressure, reduce swelling, and protect your heart, liver, and kidneys. What are tips for following this plan? General guidelines  Most people  on this plan should limit their sodium intake to 1,500-2,000 mg (milligrams) of sodium each day. Reading food labels   The Nutrition Facts label lists the amount of sodium in one serving of the food. If you eat more than one serving, you must multiply the listed amount of sodium by the number of servings.  Choose foods with less than 140 mg of sodium per serving.  Avoid foods with 300 mg of sodium or more per serving. Shopping  Look for lower-sodium products, often labeled as "low-sodium" or "no salt added."  Always check the sodium content even if foods are labeled as "unsalted" or "no salt added".  Buy fresh foods. ? Avoid canned foods and premade or frozen meals. ? Avoid canned, cured, or processed meats  Buy breads that have less than 80 mg of sodium per slice. Cooking  Eat more home-cooked food and less restaurant, buffet, and fast food.  Avoid adding salt when cooking. Use salt-free seasonings or herbs instead of table salt or sea salt. Check with your health care provider or pharmacist before using salt substitutes.  Cook with plant-based oils, such as canola, sunflower, or olive oil. Meal planning  When eating at a restaurant, ask that your food be prepared with less salt or no salt, if possible.  Avoid foods that contain MSG (monosodium glutamate). MSG is sometimes added to [place] food, bouillon, and some canned foods. What foods are recommended? The items listed may not be a complete list. Talk with your dietitian about what dietary choices are  best for you. Grains Low-sodium cereals, including oats, puffed wheat and rice, and shredded wheat. Low-sodium crackers. Unsalted rice. Unsalted pasta. Low-sodium bread. Whole-grain breads and whole-grain pasta. Vegetables Fresh or frozen vegetables. "No salt added" canned vegetables. "No salt added" tomato sauce and paste. Low-sodium or reduced-sodium tomato and vegetable juice. Fruits Fresh, frozen, or canned fruit. Fruit  juice. Meats and other protein foods Fresh or frozen (no salt added) meat, poultry, seafood, and fish. Low-sodium canned tuna and salmon. Unsalted nuts. Dried peas, beans, and lentils without added salt. Unsalted canned beans. Eggs. Unsalted nut butters. Dairy Milk. Soy milk. Cheese that is naturally low in sodium, such as ricotta cheese, fresh mozzarella, or Swiss cheese Low-sodium or reduced-sodium cheese. Cream cheese. Yogurt. Fats and oils Unsalted butter. Unsalted margarine with no trans fat. Vegetable oils such as canola or olive oils. Seasonings and other foods Fresh and dried herbs and spices. Salt-free seasonings. Low-sodium mustard and ketchup. Sodium-free salad dressing. Sodium-free light mayonnaise. Fresh or refrigerated horseradish. Lemon juice. Vinegar. Homemade, reduced-sodium, or low-sodium soups. Unsalted popcorn and pretzels. Low-salt or salt-free chips. What foods are not recommended? The items listed may not be a complete list. Talk with your dietitian about what dietary choices are best for you. Grains Instant hot cereals. Bread stuffing, pancake, and biscuit mixes. Croutons. Seasoned rice or pasta mixes. Noodle soup cups. Boxed or frozen macaroni and cheese. Regular salted crackers. Self-rising flour. Vegetables Sauerkraut, pickled vegetables, and relishes. Olives. [place] fries. Onion rings. Regular canned vegetables (not low-sodium or reduced-sodium). Regular canned tomato sauce and paste (not low-sodium or reduced-sodium). Regular tomato and vegetable juice (not low-sodium or reduced-sodium). Frozen vegetables in sauces. Meats and other protein foods Meat or fish that is salted, canned, smoked, spiced, or pickled. Bacon, ham, sausage, hotdogs, corned beef, chipped beef, packaged lunch meats, salt pork, jerky, pickled herring, anchovies, regular canned tuna, sardines, salted nuts. Dairy Processed cheese and cheese spreads. Cheese curds. Blue cheese. Feta cheese. String  cheese. Regular cottage cheese. Buttermilk. Canned milk. Fats and oils Salted butter. Regular margarine. Ghee. Bacon fat. Seasonings and other foods Onion salt, garlic salt, seasoned salt, table salt, and sea salt. Canned and packaged gravies. Worcestershire sauce. Tartar sauce. Barbecue sauce. Teriyaki sauce. Soy sauce, including reduced-sodium. Steak sauce. Fish sauce. Oyster sauce. Cocktail sauce. Horseradish that you find on the shelf. Regular ketchup and mustard. Meat flavorings and tenderizers. Bouillon cubes. Hot sauce and Tabasco sauce. Premade or packaged marinades. Premade or packaged taco seasonings. Relishes. Regular salad dressings. Salsa. Potato and tortilla chips. Corn chips and puffs. Salted popcorn and pretzels. Canned or dried soups. Pizza. Frozen entrees and pot pies. Summary  Eating less sodium can help lower your blood pressure, reduce swelling, and protect your heart, liver, and kidneys.  Most people on this plan should limit their sodium intake to 1,500-2,000 mg (milligrams) of sodium each day.  Canned, boxed, and frozen foods are high in sodium. Restaurant foods, fast foods, and pizza are also very high in sodium. You also get sodium by adding salt to food.  Try to cook at home, eat more fresh fruits and vegetables, and eat less fast food, canned, processed, or prepared foods. This information is not intended to replace advice given to you by your health care provider. Make sure you discuss any questions you have with your health care provider. Document Revised: 06/12/2017 Document Reviewed: 06/23/2016 Elsevier Patient Education  2020 [employer].

## 2020-04-18 ENCOUNTER — Telehealth: Payer: Self-pay | Admitting: *Deleted

## 2020-04-18 NOTE — Telephone Encounter (Signed)
Informed patient of compliance results and verbalized understanding was indicated. Patient is aware and agreeable to AHI being within range at 0.8. Patient is aware and agreeable to being in compliance with machine usage. Patient is aware and agreeable to no change in current pressures.

## 2020-04-18 NOTE — Telephone Encounter (Signed)
-----   Message from Sueanne Margarita, MD sent at 04/11/2020  3:54 PM EDT ----- Good AHI and compliance.  Continue current PAP settings.

## 2020-06-25 ENCOUNTER — Other Ambulatory Visit: Payer: Self-pay | Admitting: Cardiology

## 2020-07-16 ENCOUNTER — Other Ambulatory Visit: Payer: Self-pay | Admitting: Cardiology

## 2021-04-01 ENCOUNTER — Other Ambulatory Visit: Payer: Self-pay | Admitting: Cardiology

## 2021-04-02 ENCOUNTER — Other Ambulatory Visit: Payer: Self-pay

## 2021-04-02 ENCOUNTER — Encounter: Payer: Self-pay | Admitting: Cardiology

## 2021-04-02 ENCOUNTER — Ambulatory Visit: Payer: Medicare HMO | Admitting: Cardiology

## 2021-04-02 VITALS — BP 142/68 | HR 68 | Ht 68.0 in | Wt 223.2 lb

## 2021-04-02 DIAGNOSIS — I480 Paroxysmal atrial fibrillation: Secondary | ICD-10-CM | POA: Diagnosis not present

## 2021-04-02 DIAGNOSIS — I10 Essential (primary) hypertension: Secondary | ICD-10-CM | POA: Diagnosis not present

## 2021-04-02 DIAGNOSIS — G4733 Obstructive sleep apnea (adult) (pediatric): Secondary | ICD-10-CM

## 2021-04-02 LAB — CBC
Hematocrit: 36.7 % — ABNORMAL LOW (ref 37.5–51.0)
Hemoglobin: 12.6 g/dL — ABNORMAL LOW (ref 13.0–17.7)
MCH: 30.2 pg (ref 26.6–33.0)
MCHC: 34.3 g/dL (ref 31.5–35.7)
MCV: 88 fL (ref 79–97)
Platelets: 306 10*3/uL (ref 150–450)
RBC: 4.17 x10E6/uL (ref 4.14–5.80)
RDW: 13 % (ref 11.6–15.4)
WBC: 8 10*3/uL (ref 3.4–10.8)

## 2021-04-02 LAB — BASIC METABOLIC PANEL
BUN/Creatinine Ratio: 18 (ref 10–24)
BUN: 15 mg/dL (ref 8–27)
CO2: 25 mmol/L (ref 20–29)
Calcium: 9.5 mg/dL (ref 8.6–10.2)
Chloride: 100 mmol/L (ref 96–106)
Creatinine, Ser: 0.82 mg/dL (ref 0.76–1.27)
Glucose: 107 mg/dL — ABNORMAL HIGH (ref 65–99)
Potassium: 4.7 mmol/L (ref 3.5–5.2)
Sodium: 139 mmol/L (ref 134–144)
eGFR: 92 mL/min/{1.73_m2} (ref >59)

## 2021-04-02 MED ORDER — LOSARTAN POTASSIUM 100 MG PO TABS: ORAL_TABLET | ORAL | 3 refills | Status: DC

## 2021-04-02 NOTE — Progress Notes (Signed)
Cardiology Office Note:    Date:  04/02/2021   ID:  DAXTER PAULE, DOB [birth year], MRN [id number]  PCP:  Virgel Paling, MD  Cardiologist:  [name], MD    Referring MD: Virgel Paling, MD   Chief Complaint  Patient presents with   Atrial Fibrillation   Hypertension   Sleep Apnea    History of Present Illness:    Corey Hood is a 75 y.o. male with a hx of persistent AF s/p TEE/DCCV 04/2015, HTN and moderate OSA with AHI 21.3/hr with nocturnal hypoxemia and oxygen desaturations as low as 70%.  He did not tolerate CPAP due to ongoing respiratory events and underwent BiPAP titration to 16/12cm H2O.    He is here today for followup and is doing well.  He denies any chest pain or pressure, SOB, DOE (except walking up hills), PND, orthopnea, dizziness, palpitations or syncope. Sporadically he will have some mild ankle.  He is compliant with his meds and is tolerating meds with no SE.     He is doing well with his PAP device and thinks that he has gotten used to it.  He tolerates the mask and feels the pressure is adequate.  Since going on PAP he feels rested in the am and has no significant daytime sleepiness if he does not go to bed too late.  He gets up several times nightly due to his prostate.  He denies any significant mouth or nasal dryness or nasal congestion.  He does not think that he snores.     Past Medical History:  Diagnosis Date   Arthritis of both knees    BPH (benign prostatic hyperplasia)    Hypertension    Obesity (BMI 30-39.9) 04/08/2016   OSA (obstructive sleep apnea) 03/30/2017   Moderate with AHI 21.3/hr with nocturnal hypoxemia by sleep study   Persistent atrial fibrillation ([place])    s/p TEE/DCCV.  He is on Xarelto for a CHADS2VASC score 2    Past Surgical History:  Procedure Laterality Date   CARDIOVERSION N/A 12/09/2013   Procedure: CARDIOVERSION;  Surgeon: Larey Dresser, MD;  Location: [place];  Service: Cardiovascular;  Laterality: N/A;    CARDIOVERSION N/A 05/14/2015   Procedure: CARDIOVERSION;  Surgeon: Pixie Casino, MD;  Location[facility] ENDOSCOPY;  Service: Cardiovascular;  Laterality: N/A;   CARDIOVERSION N/A 02/16/2018   Procedure: CARDIOVERSION;  Surgeon: Pixie Casino, MD;  Location: [place];  Service: Cardiovascular;  Laterality: N/A;   CERVICAL SPINE SURGERY     TEE WITHOUT CARDIOVERSION N/A 12/09/2013   Procedure: TRANSESOPHAGEAL ECHOCARDIOGRAM (TEE);  Surgeon: Larey Dresser, MD;  Location: [place];  Service: Cardiovascular;  Laterality: N/A;   TEE WITHOUT CARDIOVERSION N/A 05/14/2015   Procedure: TRANSESOPHAGEAL ECHOCARDIOGRAM (TEE);  Surgeon: Pixie Casino, MD;  Location: [facility] ENDOSCOPY;  Service: Cardiovascular;  Laterality: N/A;   TEE WITHOUT CARDIOVERSION N/A 02/16/2018   Procedure: TRANSESOPHAGEAL ECHOCARDIOGRAM (TEE);  Surgeon: Pixie Casino, MD;  Location: [facility] ENDOSCOPY;  Service: Cardiovascular;  Laterality: N/A;    Current Medications: Current Meds  Medication Sig   Coenzyme Q10 (COQ10) 100 MG CAPS Take 100 mg by mouth 2 (two) times daily.    diltiazem (TIAZAC) 240 MG 24 hr capsule Take 240 mg by mouth daily.   flecainide (TAMBOCOR) 100 MG tablet Take 1 tablet (100 mg total) by mouth every 12 (twelve) hours.   hydrochlorothiazide (HYDRODIURIL) 25 MG tablet TAKE 1 TABLET(25 MG) BY MOUTH DAILY   HYDROcodone-acetaminophen (NORCO/VICODIN) 5-325 MG  tablet Take 1 tablet by mouth every 6 (six) hours as needed.   Krill Oil 500 MG CAPS Take 500 mg by mouth daily.    Magnesium 400 MG TABS Take 400 mg by mouth daily.    Multiple Vitamin (MULTIVITAMIN WITH MINERALS) TABS tablet Take 2 tablets by mouth daily.    OVER THE COUNTER MEDICATION Take 1 capsule by mouth 2 (two) times daily. Livaplex Supplement   Potassium Citrate 15 MEQ (1620 MG) TBCR Take 15 mEq by mouth 2 (two) times daily.   rivaroxaban (XARELTO) 20 MG TABS tablet Take 1 tablet (20 mg total) by mouth daily with supper.   Tragacanth (ASTRAGALUS  ROOT) POWD Take 1 capsule by mouth 2 (two) times daily.    triamcinolone cream (KENALOG) 0.1 % Apply 1 application topically daily as needed.    zinc gluconate 50 MG tablet Take 50 mg by mouth daily.   [DISCONTINUED] losartan (COZAAR) 100 MG tablet TAKE 1 TABLET(100 MG) BY MOUTH DAILY     Allergies:   Penicillins, Benazepril, Codeine, and Prednisone   Social History   Socioeconomic History   Marital status: Married    Spouse name: Not on file   Number of children: Not on file   Years of education: Not on file   Highest education level: Not on file  Occupational History   Not on file  Tobacco Use   Smoking status: Former   Smokeless tobacco: Never  Substance and Sexual Activity   Alcohol use: Yes    Alcohol/week: 28.0 standard drinks    Types: 7 Glasses of wine, 21 Cans of beer per week   Drug use: No   Sexual activity: Not on file  Other Topics Concern   Not on file  Social History Narrative   Not on file   Social Determinants of Health   Financial Resource Strain: Not on file  Food Insecurity: Not on file  Transportation Needs: Not on file  Physical Activity: Not on file  Stress: Not on file  Social Connections: Not on file     Family History: The patient's family history includes Cancer in his father and mother.  ROS:   Please see the history of present illness.    ROS  All other systems reviewed and negative.   EKGs/Labs/Other Studies Reviewed:    The following studies were reviewed today: PAp compliance download on [facility], EKG, outside labs from PAP  EKG:  EKG is ordered today and demonstrates NSR with no ST changes  Recent Labs: No results found for requested labs within last 8760 hours.   Recent Lipid Panel No results found for: CHOL, TRIG, HDL, CHOLHDL, VLDL, LDLCALC, LDLDIRECT  Physical Exam:    VS:  BP (!) 142/68   Pulse 68   Ht 5\' 8"  (1.727 m)   Wt 223 lb 3.2 oz (101.2 kg)   SpO2 97%   BMI 33.94 kg/m     Wt Readings from Last 3  Encounters:  04/02/21 223 lb 3.2 oz (101.2 kg)  03/29/20 222 lb (100.7 kg)  09/16/19 221 lb 12.8 oz (100.6 kg)     GEN: Well nourished, well developed in no acute distress HEENT: Normal NECK: No JVD; No carotid bruits LYMPHATICS: No lymphadenopathy CARDIAC:RRR, no murmurs, rubs, gallops RESPIRATORY:  Clear to auscultation without rales, wheezing or rhonchi  ABDOMEN: Soft, non-tender, non-distended MUSCULOSKELETAL:  No edema; No deformity  SKIN: Warm and dry NEUROLOGIC:  Alert and oriented x 3 PSYCHIATRIC:  Normal affect    EKG  was done today and showed NSR with no ST changes  ASSESSMENT:    1. OSA (obstructive sleep apnea)   2. Primary hypertension   3. PAF (paroxysmal atrial fibrillation) (HCC)    PLAN:    In order of problems listed above:  1.  OSA - The patient is tolerating PAP therapy well without any problems.  The patient has been using and benefiting from PAP use and will continue to benefit from therapy.  -I will get a download from his DME   2. HTN -Bp controlled on exam today -Check BMET  3.  PAF -he continues to maintain NSR on exam today with 1st degree AVB on EKG -Continue prescription drug management with Diltiazem 240mg  daily, Flecainide 100mg  BID and Xarelto 20mg  daily>refilled  -no bleeding problems on DOAC -check BMET and CBC   Medication Adjustments/Labs and Tests Ordered: Current medicines are reviewed at length with the patient today.  Concerns regarding medicines are outlined above.  Orders Placed This Encounter  Procedures   EKG 12-Lead    Meds ordered this encounter  Medications   losartan (COZAAR) 100 MG tablet    Sig: TAKE 1 TABLET(100 MG) BY MOUTH DAILY    Dispense:  90 tablet    Refill:  3     Signed, [name], MD  04/02/2021 10:28 AM    [place]

## 2021-04-02 NOTE — Addendum Note (Signed)
Addended by: Antonieta Iba on: 04/02/2021 10:35 AM   Modules accepted: Orders

## 2021-04-02 NOTE — Patient Instructions (Signed)
Medication Instructions:  Your physician recommends that you continue on your current medications as directed. Please refer to the Current Medication list given to you today.  *If you need a refill on your cardiac medications before your next appointment, please call your pharmacy*  Lab Work: TODAY: BMET and CBC  If you have labs (blood work) drawn today and your tests are completely normal, you will receive your results only by: [place] (if you have MyChart) OR A paper copy in the mail If you have any lab test that is abnormal or we need to change your treatment, we will call you to review the results.  Follow-Up: At [facility], you and your health needs are our priority.  As part of our continuing mission to provide you with exceptional heart care, we have created designated Provider Care Teams.  These Care Teams include your primary Cardiologist (physician) and Advanced Practice Providers (APPs -  Physician Assistants and Nurse Practitioners) who all work together to provide you with the care you need, when you need it.  Your next appointment:   1 year(s)  The format for your next appointment:   In Person  Provider:   You may see [name], MD or one of the following Advanced Practice Providers on your designated Care Team:   Melina Copa, PA-C Ermalinda Barrios, PA-C

## 2021-04-03 ENCOUNTER — Telehealth: Payer: Self-pay

## 2021-04-03 DIAGNOSIS — G4733 Obstructive sleep apnea (adult) (pediatric): Secondary | ICD-10-CM

## 2021-04-03 DIAGNOSIS — I10 Essential (primary) hypertension: Secondary | ICD-10-CM

## 2021-04-03 DIAGNOSIS — I4819 Other persistent atrial fibrillation: Secondary | ICD-10-CM

## 2021-04-03 MED ORDER — POTASSIUM CITRATE ER 15 MEQ (1620 MG) PO TBCR: 1.0000 | EXTENDED_RELEASE_TABLET | Freq: Every day | ORAL | 3 refills | Status: DC

## 2021-04-03 NOTE — Telephone Encounter (Signed)
-----   Message from Sueanne Margarita, MD sent at 04/03/2021 10:54 AM EDT ----- Stable labs - decrease POtassium to once daily and repeat BMET in 1 week

## 2021-04-03 NOTE — Telephone Encounter (Signed)
The patient has been notified of the result and verbalized understanding.  All questions (if any) were answered. Antonieta Iba, RN 04/03/2021 11:03 AM  Patient will decrease potassium to once daily and repeat labs in one week.

## 2021-04-10 ENCOUNTER — Telehealth: Payer: Self-pay | Admitting: *Deleted

## 2021-04-10 DIAGNOSIS — G4733 Obstructive sleep apnea (adult) (pediatric): Secondary | ICD-10-CM

## 2021-04-10 NOTE — Telephone Encounter (Signed)
Per dr Radford Pax, Change to auto Bipap  IPAP MAX 15 CM H20  EPAP MIN 4 CM H20  PS 4 CM H20  D/L IN 4 WEEKS  Call in 2 weeks to tell us how he is doing GET D/L IN 4 WEEK from [place]

## 2021-04-11 ENCOUNTER — Other Ambulatory Visit: Payer: Self-pay

## 2021-04-11 ENCOUNTER — Other Ambulatory Visit: Payer: Medicare HMO | Admitting: *Deleted

## 2021-04-11 DIAGNOSIS — I10 Essential (primary) hypertension: Secondary | ICD-10-CM

## 2021-04-11 DIAGNOSIS — I4819 Other persistent atrial fibrillation: Secondary | ICD-10-CM

## 2021-04-11 LAB — BASIC METABOLIC PANEL
BUN/Creatinine Ratio: 18 (ref 10–24)
BUN: 15 mg/dL (ref 8–27)
CO2: 23 mmol/L (ref 20–29)
Calcium: 9 mg/dL (ref 8.6–10.2)
Chloride: 101 mmol/L (ref 96–106)
Creatinine, Ser: 0.83 mg/dL (ref 0.76–1.27)
Glucose: 121 mg/dL — ABNORMAL HIGH (ref 70–99)
Potassium: 4.2 mmol/L (ref 3.5–5.2)
Sodium: 139 mmol/L (ref 134–144)
eGFR: 91 mL/min/{1.73_m2} (ref >59)

## 2021-09-23 NOTE — Telephone Encounter (Signed)
Reached out to the patient and his wife to verify he was able to get a download but I have not been able to reach either of them on the phone but I have left several messages but no return calls from either of them. ?

## 2022-04-03 ENCOUNTER — Other Ambulatory Visit: Payer: Self-pay | Admitting: Cardiology

## 2022-04-23 ENCOUNTER — Telehealth: Payer: Self-pay | Admitting: *Deleted

## 2022-04-23 NOTE — Telephone Encounter (Signed)
   Pre-operative Risk Assessment    Patient Name: Corey Hood  DOB: 09/20/1945 MRN: [id number]      Request for Surgical Clearance    Procedure:   COLONOSCOPY  Date of Surgery:  Clearance TBD                                 Surgeon:  DR. Dorrene German Surgeon's Group or Practice Name:  [facility] Phone number:  [phone] Fax number:  [phone]   Type of Clearance Requested:   - Pharmacy:  Hold Rivaroxaban (Xarelto) X'S 2 DAYS   Type of Anesthesia:  Not Indicated   Additional requests/questions:    Astrid Divine   04/23/2022, 11:25 AM

## 2022-04-28 NOTE — Telephone Encounter (Signed)
   Name: Corey Hood  DOB: 01-28-1946  MRN: [id number]  Primary Cardiologist: [name], MD  Chart reviewed as part of pre-operative protocol coverage. Because of Corey Hood's past medical history and time since last visit, he will require a follow-up in-office visit in order to better assess preoperative cardiovascular risk.  Pre-op covering staff: - Please schedule appointment and call patient to inform them. If patient already had an upcoming appointment within acceptable timeframe, please add "pre-op clearance" to the appointment notes so provider is aware. - Please contact requesting surgeon's office via preferred method (i.e, phone, fax) to inform them of need for appointment prior to surgery.  This message will also be routed to pharmacy pool for input on holding Xarelto as requested below so that this information is available to the clearing provider at time of patient's appointment.   Lenna Sciara, NP  04/28/2022, 12:27 PM

## 2022-04-29 NOTE — Telephone Encounter (Signed)
I s/w the pt and he is agreeable to in office appt for pre op clearance. Pt has appt with Ambrose Pancoast, NP 05/02/22 @ 1:55.   In our conversation pt states he would like to transfer care to [place] office due to closer to his home and would make it easier for driving to the office for appts. I advised the pt to be sure to d/w NP at appt 05/02/22. Pt thanked me for the help.

## 2022-05-01 NOTE — Progress Notes (Signed)
Office Visit    Patient Name: Corey Hood Date of Encounter: 05/02/2022  Primary Care Provider:  Virgel Paling, MD Primary Cardiologist:  [name], MD Primary Electrophysiologist: None  Chief Complaint    Corey Hood is a 76 y.o. male with PMH of HTN, moderate OSA, persistent AF s/p TEE/DCCV 04/2015, BPH, arthritis who presents today for surgical clearance for colonoscopy.  Past Medical History    Past Medical History:  Diagnosis Date   Arthritis of both knees    BPH (benign prostatic hyperplasia)    Hypertension    Obesity (BMI 30-39.9) 04/08/2016   OSA (obstructive sleep apnea) 03/30/2017   Moderate with AHI 21.3/hr with nocturnal hypoxemia by sleep study   Persistent atrial fibrillation ([place])    s/p TEE/DCCV.  He is on Xarelto for a CHADS2VASC score 2   Past Surgical History:  Procedure Laterality Date   CARDIOVERSION N/A 12/09/2013   Procedure: CARDIOVERSION;  Surgeon: Larey Dresser, MD;  Location: [place];  Service: Cardiovascular;  Laterality: N/A;   CARDIOVERSION N/A 05/14/2015   Procedure: CARDIOVERSION;  Surgeon: Pixie Casino, MD;  Location: [facility] ENDOSCOPY;  Service: Cardiovascular;  Laterality: N/A;   CARDIOVERSION N/A 02/16/2018   Procedure: CARDIOVERSION;  Surgeon: Pixie Casino, MD;  Location: [place];  Service: Cardiovascular;  Laterality: N/A;   CERVICAL SPINE SURGERY     TEE WITHOUT CARDIOVERSION N/A 12/09/2013   Procedure: TRANSESOPHAGEAL ECHOCARDIOGRAM (TEE);  Surgeon: Larey Dresser, MD;  Location: [place];  Service: Cardiovascular;  Laterality: N/A;   TEE WITHOUT CARDIOVERSION N/A 05/14/2015   Procedure: TRANSESOPHAGEAL ECHOCARDIOGRAM (TEE);  Surgeon: Pixie Casino, MD;  Location: [facility] ENDOSCOPY;  Service: Cardiovascular;  Laterality: N/A;   TEE WITHOUT CARDIOVERSION N/A 02/16/2018   Procedure: TRANSESOPHAGEAL ECHOCARDIOGRAM (TEE);  Surgeon: Pixie Casino, MD;  Location: [facility] ENDOSCOPY;  Service: Cardiovascular;  Laterality:  N/A;    Allergies  Allergies  Allergen Reactions   Penicillins Hives and Other (See Comments)    Has patient had a PCN reaction causing immediate rash, facial/tongue/throat swelling, SOB or lightheadedness with hypotension: Unknown Has patient had a PCN reaction causing severe rash involving mucus membranes or skin necrosis: No Has patient had a PCN reaction that required hospitalization: No Has patient had a PCN reaction occurring within the last 10 years: No If all of the above answers are "NO", then may proceed with Cephalosporin use.  Has patient had a PCN reaction causing immediate rash, facial/tongue/throat swelling, SOB or lightheadedness with hypotension: Unknown Has patient had a PCN reaction causing severe rash involving mucus membranes or skin necrosis: No Has patient had a PCN reaction that required hospitalization: No Has patient had a PCN reaction occurring within the last 10 years: No If all of the above answers are "NO", then may proceed with Cephalosporin use.   Benazepril Cough and Other (See Comments)   Codeine Other (See Comments)    "makes me loopy" "makes me loopy" "makes me loopy" "makes me loopy" "makes me loopy" Other reaction(s): Other (See Comments) "makes me loopy"   Prednisone Other (See Comments)    States "not taking anymore" States "not taking anymore" States "not taking anymore"    History of Present Illness    Corey Hood  is a 76 year old male with the above mention past medical history who presents today for surgical clearance.  He was recently seen by Dr. Radford Pax and 2015 for complaint of fatigue and decreased exercise tolerance.  EKG was  completed and showed patient was in AF with RVR and was started on metoprolol.  Patient was treated with flecainide and underwent DCCV in 2015 with subsequent DCCV in 2016.  Patient has obstructive sleep apnea and wears a PAP device.  He was last seen by Dr. Radford Pax on 03/2021 for annual follow-up.  During his  visit patient was doing well and denied any new cardiac complaints.  Patient's blood pressure was well controlled and he was sinus rhythm with first-degree AVB on EKG.  He continues to maintain sinus rhythm with Cardizem and flecainide.  He is on his Xarelto 20 mg with no bleeding complications.  Mr. Farruggia presents today for preoperative clearance for upcoming colonoscopy.  Since last being seen in the office patient reports that he has been doing well from a cardiac perspective with no complaints of chest pain or shortness of breath.  His blood pressure today was initially 140/70 and was 128/62 on recheck.  Mr. Shin is tolerating all of his medications without any adverse reactions.  He is currently exercising by swimming at his local gym.  He is also following a heart healthy low-sodium diet.  Patient denies chest pain, palpitations, dyspnea, PND, orthopnea, nausea, vomiting, dizziness, syncope, edema, weight gain, or early satiety.  Home Medications    Current Outpatient Medications  Medication Sig Dispense Refill   Cholecalciferol 125 MCG (5000 UT) TABS Take by mouth.     Coenzyme Q10 (COQ10) 100 MG CAPS Take 100 mg by mouth 2 (two) times daily.      diltiazem (TIAZAC) 240 MG 24 hr capsule Take 240 mg by mouth daily.     flecainide (TAMBOCOR) 100 MG tablet Take 1 tablet (100 mg total) by mouth every 12 (twelve) hours. 60 tablet 11   hydrochlorothiazide (HYDRODIURIL) 25 MG tablet TAKE 1 TABLET(25 MG) BY MOUTH DAILY 90 tablet 3   Krill Oil 500 MG CAPS Take 500 mg by mouth daily.      losartan (COZAAR) 100 MG tablet TAKE 1 TABLET(100 MG) BY MOUTH DAILY. Please call [phone] to schedule an appointment for future refills. Thank you. 1st attempt. 30 tablet 0   Magnesium 400 MG TABS Take 400 mg by mouth daily.      Multiple Vitamin (MULTIVITAMIN WITH MINERALS) TABS tablet Take 2 tablets by mouth daily.      OVER THE COUNTER MEDICATION Take 1 capsule by mouth 2 (two) times daily. Livaplex Supplement      Potassium Citrate 15 MEQ (1620 MG) TBCR Take 1 tablet by mouth daily. 90 tablet 3   rivaroxaban (XARELTO) 20 MG TABS tablet Take 1 tablet (20 mg total) by mouth daily with supper. 30 tablet 6   Tragacanth (ASTRAGALUS ROOT) POWD Take 1 capsule by mouth 2 (two) times daily.      triamcinolone cream (KENALOG) 0.1 % Apply 1 application topically daily as needed.      zinc gluconate 50 MG tablet Take 50 mg by mouth daily.     finasteride (PROSCAR) 5 MG tablet Take 5 mg by mouth daily. (Patient not taking: Reported on 05/02/2022)  0   HYDROcodone-acetaminophen (NORCO/VICODIN) 5-325 MG tablet Take 1 tablet by mouth every 6 (six) hours as needed. (Patient not taking: Reported on 05/02/2022)     No current facility-administered medications for this visit.     Review of Systems  Please see the history of present illness.    (+) Bilateral hip pain  All other systems reviewed and are otherwise negative except as noted above.  Physical Exam    Wt Readings from Last 3 Encounters:  05/02/22 213 lb 6.4 oz (96.8 kg)  04/02/21 223 lb 3.2 oz (101.2 kg)  03/29/20 222 lb (100.7 kg)   VS: Vitals:   05/02/22 1404 05/02/22 1411  BP: (!) 140/70 128/62  Pulse: 63   SpO2: 97%   ,Body mass index is 32.45 kg/m.  Constitutional:      Appearance: Healthy appearance. Not in distress.  Neck:     Vascular: JVD normal.  Pulmonary:     Effort: Pulmonary effort is normal.     Breath sounds: No wheezing. No rales. Diminished in the bases Cardiovascular:     Normal rate. Regular rhythm. Normal S1. Normal S2.      Murmurs: There is no murmur.  Edema:    Peripheral edema absent.  Abdominal:     Palpations: Abdomen is soft non tender. There is no hepatomegaly.  Skin:    General: Skin is warm and dry.  Neurological:     General: No focal deficit present.     Mental Status: Alert and oriented to person, place and time.     Cranial Nerves: Cranial nerves are intact.  EKG/LABS/Other Studies Reviewed     ECG personally reviewed by me today -sinus rhythm with incomplete RBBB and rate of 63 bpm with QT interval of 403 ms  Risk Assessment/Calculations:    CHA2DS2-VASc Score = 2   This indicates a 2.2% annual risk of stroke. The patient's score is based upon: CHF History: 0 HTN History: 0 Diabetes History: 0 Stroke History: 0 Vascular Disease History: 0 Age Score: 2 Gender Score: 0     Lab Results  Component Value Date   WBC 8.0 04/02/2021   HGB 12.6 (L) 04/02/2021   HCT 36.7 (L) 04/02/2021   MCV 88 04/02/2021   PLT 306 04/02/2021   Lab Results  Component Value Date   CREATININE 0.83 04/11/2021   BUN 15 04/11/2021   NA 139 04/11/2021   K 4.2 04/11/2021   CL 101 04/11/2021   CO2 23 04/11/2021   Lab Results  Component Value Date   ALT 53 12/07/2013   AST 29 12/07/2013   ALKPHOS 58 12/07/2013   BILITOT 0.4 12/07/2013   No results found for: "CHOL", "HDL", "LDLCALC", "LDLDIRECT", "TRIG", "CHOLHDL"  No results found for: "HGBA1C"  Assessment & Plan    1.  Surgical clearance: The patient affirms he has been doing well without any new cardiac symptoms. They are able to achieve 6 METS without cardiac limitations. Therefore, based on ACC/AHA guidelines, the patient would be at acceptable risk for the planned procedure without further cardiovascular testing. The patient was advised that if he develops new symptoms prior to surgery to contact our office to arrange for a follow-up visit, and he verbalized understanding.   Mr. Hillyard perioperative risk of a major cardiac event is 0.4% according to the Revised Cardiac Risk Index (RCRI).  Therefore, he is at low risk for perioperative complications.   His functional capacity is good at 6.27 METs according to the Duke Activity Status Index (DASI). Recommendations: According to ACC/AHA guidelines, no further cardiovascular testing needed.  The patient may proceed to surgery at acceptable risk.   Antiplatelet and/or Anticoagulation  Recommendations:  Xarelto (Rivaroxaban) can be held for 2 days prior to surgery.  Please resume post op when felt to be safe.      2.  Paroxysmal atrial fibrillation: -Today patient is rate controlled 63 bpm -Continue rate  control with Cardizem 240 mg and flecainide 100 mg daily -No bleeding reported on Xarelto 20 mg  3.  Essential hypertension: -Patient's blood pressure today was well controlled at 128/62 -Continue losartan 100 mg daily  4.  Obesity: -Patient's current BMI is 32.45 kg/m   Disposition: Follow-up with [name], MD or APP in 4 months    Medication Adjustments/Labs and Tests Ordered: Current medicines are reviewed at length with the patient today.  Concerns regarding medicines are outlined above.   Signed, Mable Fill, Marissa Nestle, NP 05/02/2022, 4:33 PM [facility]  Note:  This document was prepared using Dragon voice recognition software and may include unintentional dictation errors.

## 2022-05-02 ENCOUNTER — Encounter: Payer: Self-pay | Admitting: Nurse Practitioner

## 2022-05-02 ENCOUNTER — Ambulatory Visit: Payer: Medicare HMO | Attending: Nurse Practitioner | Admitting: Nurse Practitioner

## 2022-05-02 VITALS — BP 128/62 | HR 63 | Ht 68.0 in | Wt 213.4 lb

## 2022-05-02 DIAGNOSIS — I10 Essential (primary) hypertension: Secondary | ICD-10-CM

## 2022-05-02 DIAGNOSIS — E669 Obesity, unspecified: Secondary | ICD-10-CM

## 2022-05-02 DIAGNOSIS — I480 Paroxysmal atrial fibrillation: Secondary | ICD-10-CM | POA: Diagnosis not present

## 2022-05-02 DIAGNOSIS — Z01810 Encounter for preprocedural cardiovascular examination: Secondary | ICD-10-CM

## 2022-05-02 NOTE — Telephone Encounter (Signed)
Patient with diagnosis of atrial fibrillation on Xarelto for anticoagulation.    Procedure: colonoscopy Date of procedure: TBD   CHA2DS2-VASc Score = 3   This indicates a 3.2% annual risk of stroke. The patient's score is based upon: CHF History: 0 HTN History: 1 Diabetes History: 0 Stroke History: 0 Vascular Disease History: 0 Age Score: 2 Gender Score: 0    CrCl 101 Platelet count 327  Per office protocol, patient can hold Xarelto for 2 days prior to procedure.   Patient will not need bridging with Lovenox (enoxaparin) around procedure.  **This guidance is not considered finalized until pre-operative APP has relayed final recommendations.**

## 2022-05-02 NOTE — Patient Instructions (Signed)
Medication Instructions:  NO CHANGE *If you need a refill on your cardiac medications before your next appointment, please call your pharmacy*   Lab Work: NONE If you have labs (blood work) drawn today and your tests are completely normal, you will receive your results only by: [place] (if you have MyChart) OR A paper copy in the mail If you have any lab test that is abnormal or we need to change your treatment, we will call you to review the results.   Testing/Procedures: NONE   Follow-Up: At [facility], you and your health needs are our priority.  As part of our continuing mission to provide you with exceptional heart care, we have created designated Provider Care Teams.  These Care Teams include your primary Cardiologist (physician) and Advanced Practice Providers (APPs -  Physician Assistants and Nurse Practitioners) who all work together to provide you with the care you need, when you need it.  We recommend signing up for the patient portal called "MyChart".  Sign up information is provided on this After Visit Summary.  MyChart is used to connect with patients for Virtual Visits (Telemedicine).  Patients are able to view lab/test results, encounter notes, upcoming appointments, etc.  Non-urgent messages can be sent to your provider as well.   To learn more about what you can do with MyChart, go to [email or web address].    Your next appointment:  KEEP JAN APPOINTMENT  The format for your next appointment:     Provider:       Other Instructions NONE  Important Information About Sugar

## 2022-05-11 ENCOUNTER — Other Ambulatory Visit: Payer: Self-pay | Admitting: Cardiology

## 2022-05-12 ENCOUNTER — Other Ambulatory Visit: Payer: Self-pay

## 2022-05-12 MED ORDER — LOSARTAN POTASSIUM 100 MG PO TABS: ORAL_TABLET | ORAL | 3 refills | Status: DC

## 2022-06-18 ENCOUNTER — Encounter (HOSPITAL_BASED_OUTPATIENT_CLINIC_OR_DEPARTMENT_OTHER): Payer: Self-pay | Admitting: Emergency Medicine

## 2022-06-18 ENCOUNTER — Emergency Department (HOSPITAL_BASED_OUTPATIENT_CLINIC_OR_DEPARTMENT_OTHER)
Admission: EM | Admit: 2022-06-18 | Discharge: 2022-06-18 | Disposition: A | Discharge status: Home / Self Care | Payer: Medicare HMO | Attending: Emergency Medicine | Admitting: Emergency Medicine

## 2022-06-18 ENCOUNTER — Other Ambulatory Visit: Payer: Self-pay

## 2022-06-18 DIAGNOSIS — R319 Hematuria, unspecified: Secondary | ICD-10-CM | POA: Diagnosis not present

## 2022-06-18 DIAGNOSIS — R339 Retention of urine, unspecified: Secondary | ICD-10-CM | POA: Diagnosis present

## 2022-06-18 LAB — CBC WITH DIFFERENTIAL/PLATELET
Abs Immature Granulocytes: 0.04 10*3/uL (ref 0.00–0.07)
Basophils Absolute: 0 10*3/uL (ref 0.0–0.1)
Basophils Relative: 1 %
Eosinophils Absolute: 0.1 10*3/uL (ref 0.0–0.5)
Eosinophils Relative: 1 %
HCT: 36.6 % — ABNORMAL LOW (ref 39.0–52.0)
Hemoglobin: 12.4 g/dL — ABNORMAL LOW (ref 13.0–17.0)
Immature Granulocytes: 1 %
Lymphocytes Relative: 14 %
Lymphs Abs: 1.1 10*3/uL (ref 0.7–4.0)
MCH: 30.1 pg (ref 26.0–34.0)
MCHC: 33.9 g/dL (ref 30.0–36.0)
MCV: 88.8 fL (ref 80.0–100.0)
Monocytes Absolute: 0.8 10*3/uL (ref 0.1–1.0)
Monocytes Relative: 9 %
Neutro Abs: 6.1 10*3/uL (ref 1.7–7.7)
Neutrophils Relative %: 74 %
Platelets: 294 10*3/uL (ref 150–400)
RBC: 4.12 MIL/uL — ABNORMAL LOW (ref 4.22–5.81)
RDW: 13 % (ref 11.5–15.5)
WBC: 8.1 10*3/uL (ref 4.0–10.5)
nRBC: 0 % (ref 0.0–0.2)

## 2022-06-18 LAB — URINALYSIS, ROUTINE W REFLEX MICROSCOPIC
Bilirubin Urine: NEGATIVE
Glucose, UA: NEGATIVE mg/dL
Ketones, ur: NEGATIVE mg/dL
Leukocytes,Ua: NEGATIVE
Nitrite: NEGATIVE
Protein, ur: NEGATIVE mg/dL
Specific Gravity, Urine: 1.025 (ref 1.005–1.030)
pH: 5 (ref 5.0–8.0)

## 2022-06-18 LAB — BASIC METABOLIC PANEL
Anion gap: 7 (ref 5–15)
BUN: 20 mg/dL (ref 8–23)
CO2: 22 mmol/L (ref 22–32)
Calcium: 9.3 mg/dL (ref 8.9–10.3)
Chloride: 108 mmol/L (ref 98–111)
Creatinine, Ser: 0.88 mg/dL (ref 0.61–1.24)
GFR, Estimated: >60 mL/min (ref >60)
Glucose, Bld: 114 mg/dL — ABNORMAL HIGH (ref 70–99)
Potassium: 4.4 mmol/L (ref 3.5–5.1)
Sodium: 137 mmol/L (ref 135–145)

## 2022-06-18 LAB — URINALYSIS, MICROSCOPIC (REFLEX): RBC / HPF: >50 RBC/hpf (ref 0–5)

## 2022-06-18 NOTE — ED Provider Notes (Signed)
[place] EMERGENCY DEPARTMENT Provider Note   CSN: [id number] Arrival date & time: 06/18/22  1658     History  Chief Complaint  Patient presents with   Urinary Retention    Corey Hood is a 76 y.o. male with a PMHx of BPH who presents to the ED with concerns for urinary retention onset this morning. Pt noted that he has been dribbling in his diaper since yesterday. Notes that he had radiation to his prostate in the past. Has a follow up appointment with his Urologist tomorrow for CT hematuria protocol. Notes that he was taking xarelto starting 06/05/22, however, discontinued due to blood in his urine with clots noted. Pt notes the hematuria discontinued yesterday. Denies abdominal pain, nausea, vomiting, back pain.     The history is provided by the patient and the spouse. No language interpreter was used.       Home Medications Prior to Admission medications   Medication Sig Start Date End Date Taking? Authorizing Provider  Cholecalciferol 125 MCG (5000 UT) TABS Take by mouth.    [provider]  Coenzyme Q10 (COQ10) 100 MG CAPS Take 100 mg by mouth 2 (two) times daily.     [provider]  diltiazem (TIAZAC) 240 MG 24 hr capsule Take 240 mg by mouth daily.    [provider]  finasteride (PROSCAR) 5 MG tablet Take 5 mg by mouth daily. Patient not taking: Reported on 05/02/2022 03/29/15   [provider]  flecainide (TAMBOCOR) 100 MG tablet Take 1 tablet (100 mg total) by mouth every 12 (twelve) hours. 04/08/16   Sueanne Margarita, MD  hydrochlorothiazide (HYDRODIURIL) 25 MG tablet TAKE 1 TABLET(25 MG) BY MOUTH DAILY 07/18/20   Sueanne Margarita, MD  HYDROcodone-acetaminophen (NORCO/VICODIN) 5-325 MG tablet Take 1 tablet by mouth every 6 (six) hours as needed. Patient not taking: Reported on 05/02/2022    [provider]  Javier Docker Oil 500 MG CAPS Take 500 mg by mouth daily.     [provider]  losartan (COZAAR) 100 MG  tablet TAKE 1 TABLET(100 MG) BY MOUTH DAILY. 05/12/22   Sueanne Margarita, MD  Magnesium 400 MG TABS Take 400 mg by mouth daily.     [provider]  Multiple Vitamin (MULTIVITAMIN WITH MINERALS) TABS tablet Take 2 tablets by mouth daily.     [provider]  OVER THE COUNTER MEDICATION Take 1 capsule by mouth 2 (two) times daily. Livaplex Supplement    [provider]  Potassium Citrate 15 MEQ (1620 MG) TBCR Take 1 tablet by mouth daily. 04/03/21   Sueanne Margarita, MD  rivaroxaban (XARELTO) 20 MG TABS tablet Take 1 tablet (20 mg total) by mouth daily with supper. 07/18/14   Sueanne Margarita, MD  Tragacanth (ASTRAGALUS ROOT) POWD Take 1 capsule by mouth 2 (two) times daily.     [provider]  triamcinolone cream (KENALOG) 0.1 % Apply 1 application topically daily as needed.  08/10/19   [provider]  zinc gluconate 50 MG tablet Take 50 mg by mouth daily.    [provider]      Allergies    Penicillins, Benazepril, Codeine, and Prednisone    Review of Systems   Review of Systems  All other systems reviewed and are negative.   Physical Exam Updated Vital Signs BP (!) 188/75 (BP Location: Right Arm)   Pulse 85   Temp 98 F (36.7 C)   Resp 18  Ht '5\' 8"'$  (1.727 m)   Wt 95.3 kg   SpO2 99%   BMI 31.93 kg/m  Physical Exam Vitals and nursing note reviewed. Exam conducted with a chaperone present.  Constitutional:      General: He is not in acute distress.    Appearance: He is not diaphoretic.  HENT:     Head: Normocephalic and atraumatic.     Mouth/Throat:     Pharynx: No oropharyngeal exudate.  Eyes:     General: No scleral icterus.    Conjunctiva/sclera: Conjunctivae normal.  Cardiovascular:     Rate and Rhythm: Normal rate and regular rhythm.     Pulses: Normal pulses.     Heart sounds: Normal heart sounds.  Pulmonary:     Effort: Pulmonary effort is normal. No respiratory distress.     Breath sounds: Normal breath sounds.  No wheezing.  Abdominal:     General: Bowel sounds are normal.     Palpations: Abdomen is soft. There is no mass.     Tenderness: There is no abdominal tenderness. There is no guarding or rebound.     Hernia: There is no hernia in the left inguinal area or right inguinal area.  Genitourinary:    Penis: Circumcised. No tenderness, discharge or swelling.      Testes: Normal.        Right: Mass, tenderness or swelling not present.        Left: Mass, tenderness or swelling not present.     Epididymis:     Right: Normal.     Left: Normal.     Comments: RN chaperone present for exam.  No gross blood noted at urethral meatus.  No tenderness to palpation noted to penis or bilateral testicles.  No swelling noted to bilateral testicles.  No overlying skin changes. Musculoskeletal:        General: Normal range of motion.     Cervical back: Normal range of motion and neck supple.  Skin:    General: Skin is warm and dry.  Neurological:     Mental Status: He is alert.  Psychiatric:        Behavior: Behavior normal.     ED Results / Procedures / Treatments   Labs (all labs ordered are listed, but only abnormal results are displayed) Labs Reviewed  URINALYSIS, ROUTINE W REFLEX MICROSCOPIC - Abnormal; Notable for the following components:      Result Value   Hgb urine dipstick LARGE (*)    All other components within normal limits  CBC WITH DIFFERENTIAL/PLATELET - Abnormal; Notable for the following components:   RBC 4.12 (*)    Hemoglobin 12.4 (*)    HCT 36.6 (*)    All other components within normal limits  BASIC METABOLIC PANEL - Abnormal; Notable for the following components:   Glucose, Bld 114 (*)    All other components within normal limits  URINALYSIS, MICROSCOPIC (REFLEX) - Abnormal; Notable for the following components:   Bacteria, UA FEW (*)    All other components within normal limits    EKG None  Radiology No results found.  Procedures Procedures    Medications  Ordered in ED Medications - No data to display  ED Course/ Medical Decision Making/ A&P Clinical Course as of 06/19/22 1438  Wed Jun 18, 2022  1738 Notified patient with 243 ml on bladder scan, nursing staff to repeat scan. [SB]  1845 Reevaluated and noted improvement of symptoms with catheter placement in the ED. UA inspection  without gross hematuria. [SB]  1856 In-depth conversation held with patient as well as wife at bedside regarding importance of maintaining follow-up with urologist tomorrow for CT as scheduled.  Discussed with patient and wife that since the CT will be obtained tomorrow, we can defer to the CT that will be obtained with his urologist.  Patient and wife are agreeable to following up as scheduled with the urologist tomorrow for CT scan and further evaluation.  Discussed with patient discharge treatment plan.  Answered all available questions.  Patient appears safe for discharge at this time. [SB]    Clinical Course User Index [SB] Avah Bashor A, PA-C                           Medical Decision Making Amount and/or Complexity of Data Reviewed Labs: ordered.   Pt presents with concerns for urinary retention onset this morning. Notes that he has had some dribbling today. History of kidney stones. No longer on xarelto. Vital signs, pt afebrile. On exam, pt with no acute cardiovascular, respiratory, abdominal exam findings. Differential diagnosis includes BPH, blood clot, neoplasm, urethral stricture.    Additional history obtained:  Additional history obtained from Spouse/Significant Other External records from outside source obtained and reviewed including: Patient has a follow-up appointment with his urologist tomorrow for CT hematuria protocol.  Also was recommended for an outpatient cystoscopy.  Labs:  I ordered, and personally interpreted labs.  The pertinent results include:   CBC unremarkable BMP unremarkable Urinalysis notable for large amount of hemoglobin  otherwise nitrate and leukocyte negative   Disposition: Presenting suspicious for acute urinary retention.  Patient with a urologist appointment tomorrow, 06/19/2022 for CT hematuria protocol.  In-depth conversation held with patient as well as wife at bedside regarding CT scan in the ED to rule out causes of acute urinary retention as either neoplasm, BPH, urethral stricture.  However, discussed with patient and wife that since patient is obtaining a CT scan with his urologist tomorrow and that patient's symptoms have resolved after Foley catheter placement in the emergency department that he can follow-up with his urologist as scheduled for the CT scan as already planned.  Patient and wife are agreeable at this time.  Case discussed with attending who agrees with discharge treatment plan.. After consideration of the diagnostic results and the patients response to treatment, I feel that the patient would benefit from Discharge home. Supportive care measures and strict return precautions discussed with patient at bedside. Pt acknowledges and verbalizes understanding. Pt appears safe for discharge. Follow up as indicated in discharge paperwork.    This chart was dictated using voice recognition software, Dragon. Despite the best efforts of this provider to proofread and correct errors, errors may still occur which can change documentation meaning.   Final Clinical Impression(s) / ED Diagnoses Final diagnoses:  Urinary retention    Rx / DC Orders ED Discharge Orders     None         Powell Halbert A, PA-C 06/19/22 1439    Tegeler, Gwenyth Allegra, MD 06/19/22 [id number]

## 2022-06-18 NOTE — ED Notes (Signed)
14 Fr urinary catheter placed. Pt tolerated well. 200 ml clear urine noted for output

## 2022-06-18 NOTE — ED Notes (Signed)
Pt transported to fast track room 2. Bladder scan noted 243 ml. Pt noted to be in pain 10/10

## 2022-06-18 NOTE — ED Triage Notes (Addendum)
Pt reports hematuria recently with urinary retention today; sts last void was this morning; pt appears uncomfortable

## 2022-06-18 NOTE — ED Notes (Signed)
D/c paperwork reviewed with pt, including f/u care. Pt verbalized understanding, no questions or concerns at time of d/c.

## 2022-06-18 NOTE — Discharge Instructions (Signed)
It was a pleasure taking care of you today!  Your labs did not show any concerning findings today.  Your urine did not show any concerns for infection, it did show blood in the urine.  You will keep the Foley catheter in until you are evaluated by your urologist tomorrow.  You may return to the emergency department if you are experiencing increasing/worsening symptoms.

## 2022-08-08 ENCOUNTER — Ambulatory Visit: Payer: Medicare HMO | Attending: Cardiology | Admitting: Cardiology

## 2022-08-08 ENCOUNTER — Encounter: Payer: Self-pay | Admitting: Cardiology

## 2022-08-08 VITALS — BP 158/72 | HR 72 | Ht 68.0 in | Wt 215.0 lb

## 2022-08-08 DIAGNOSIS — I480 Paroxysmal atrial fibrillation: Secondary | ICD-10-CM | POA: Diagnosis not present

## 2022-08-08 DIAGNOSIS — I10 Essential (primary) hypertension: Secondary | ICD-10-CM | POA: Diagnosis not present

## 2022-08-08 DIAGNOSIS — I700 Atherosclerosis of aorta: Secondary | ICD-10-CM | POA: Diagnosis not present

## 2022-08-08 DIAGNOSIS — G4733 Obstructive sleep apnea (adult) (pediatric): Secondary | ICD-10-CM | POA: Diagnosis not present

## 2022-08-08 NOTE — Patient Instructions (Signed)
Medication Instructions:  Your physician recommends that you continue on your current medications as directed. Please refer to the Current Medication list given to you today.  *If you need a refill on your cardiac medications before your next appointment, please call your pharmacy*   Lab Work: None.  If you have labs (blood work) drawn today and your tests are completely normal, you will receive your results only by: [place] (if you have MyChart) OR A paper copy in the mail If you have any lab test that is abnormal or we need to change your treatment, we will call you to review the results.   Testing/Procedures: None.   Follow-Up: At [facility], you and your health needs are our priority.  As part of our continuing mission to provide you with exceptional heart care, we have created designated Provider Care Teams.  These Care Teams include your primary Cardiologist (physician) and Advanced Practice Providers (APPs -  Physician Assistants and Nurse Practitioners) who all work together to provide you with the care you need, when you need it.  We recommend signing up for the patient portal called "MyChart".  Sign up information is provided on this After Visit Summary.  MyChart is used to connect with patients for Virtual Visits (Telemedicine).  Patients are able to view lab/test results, encounter notes, upcoming appointments, etc.  Non-urgent messages can be sent to your provider as well.   To learn more about what you can do with MyChart, go to [email or web address].    Your next appointment:   1 year(s)  Provider:   [name], MD     Other Instructions Please complete a blood pressure log for one week. Please check your blood pressure twice a day (one time in the morning and one time in the afternoon) for one week and then call us with the results.

## 2022-08-08 NOTE — Progress Notes (Signed)
Cardiology Office Note:    Date:  08/08/2022   ID:  Corey Hood, DOB [birth year], MRN [id number]  PCP:  Virgel Paling, MD  Cardiologist:  [name], MD    Referring MD: Virgel Paling, MD   Chief Complaint  Patient presents with   Sleep Apnea   Hypertension   Atrial Fibrillation    History of Present Illness:    Corey Hood is a 77 y.o. male with a hx of persistent AF s/p TEE/DCCV 04/2015, HTN and moderate OSA with AHI 21.3/hr with nocturnal hypoxemia and oxygen desaturations as low as 70%.  He did not tolerate CPAP due to ongoing respiratory events and underwent BiPAP titration to 16/12cm H2O.    He is here today for followup and is doing well.  He denies any chest pain or pressure, SOB, DOE, PND, orthopnea, LE edema, dizziness, palpitations or syncope. He is compliant with his meds and is tolerating meds with no SE.    He is doing well with his PAP device and thinks that he has gotten used to it.  He tolerates the full face mask and feels the pressure is adequate.  He gets up a lot at night to go to the bathroom due to his prostate issues.  Since going on PAP he feels rested in the am and has no significant daytime sleepiness.  He denies any significant nasal dryness or nasal congestion. He has a lot of mouth dryness. He does not think that he snores.    Past Medical History:  Diagnosis Date   Aortic atherosclerosis (HCC)    Arthritis of both knees    BPH (benign prostatic hyperplasia)    Hypertension    Obesity (BMI 30-39.9) 04/08/2016   OSA (obstructive sleep apnea) 03/30/2017   Moderate with AHI 21.3/hr with nocturnal hypoxemia by sleep study   Persistent atrial fibrillation ([place])    s/p TEE/DCCV.  He is on Xarelto for a CHADS2VASC score 2    Past Surgical History:  Procedure Laterality Date   CARDIOVERSION N/A 12/09/2013   Procedure: CARDIOVERSION;  Surgeon: Larey Dresser, MD;  Location: [place];  Service: Cardiovascular;  Laterality: N/A;   CARDIOVERSION  N/A 05/14/2015   Procedure: CARDIOVERSION;  Surgeon: Pixie Casino, MD;  Location: [facility] ENDOSCOPY;  Service: Cardiovascular;  Laterality: N/A;   CARDIOVERSION N/A 02/16/2018   Procedure: CARDIOVERSION;  Surgeon: Pixie Casino, MD;  Location: [place];  Service: Cardiovascular;  Laterality: N/A;   CERVICAL SPINE SURGERY     TEE WITHOUT CARDIOVERSION N/A 12/09/2013   Procedure: TRANSESOPHAGEAL ECHOCARDIOGRAM (TEE);  Surgeon: Larey Dresser, MD;  Location: [place];  Service: Cardiovascular;  Laterality: N/A;   TEE WITHOUT CARDIOVERSION N/A 05/14/2015   Procedure: TRANSESOPHAGEAL ECHOCARDIOGRAM (TEE);  Surgeon: Pixie Casino, MD;  Location[facility] ENDOSCOPY;  Service: Cardiovascular;  Laterality: N/A;   TEE WITHOUT CARDIOVERSION N/A 02/16/2018   Procedure: TRANSESOPHAGEAL ECHOCARDIOGRAM (TEE);  Surgeon: Pixie Casino, MD;  Location: [facility] ENDOSCOPY;  Service: Cardiovascular;  Laterality: N/A;    Current Medications: Current Meds  Medication Sig   Coenzyme Q10 (COQ10) 100 MG CAPS Take 100 mg by mouth 2 (two) times daily.    diltiazem (TIAZAC) 240 MG 24 hr capsule Take 240 mg by mouth daily.   flecainide (TAMBOCOR) 100 MG tablet Take 1 tablet (100 mg total) by mouth every 12 (twelve) hours.   HYDROcodone-acetaminophen (NORCO/VICODIN) 5-325 MG tablet Take 1 tablet by mouth every 6 (six) hours as needed.   Krill Oil  500 MG CAPS Take 500 mg by mouth daily.    losartan (COZAAR) 100 MG tablet TAKE 1 TABLET(100 MG) BY MOUTH DAILY.   Magnesium 400 MG TABS Take 400 mg by mouth daily.    Multiple Vitamin (MULTIVITAMIN WITH MINERALS) TABS tablet Take 2 tablets by mouth daily.    OVER THE COUNTER MEDICATION Take 1 capsule by mouth 2 (two) times daily. Livaplex Supplement   potassium chloride SA (KLOR-CON M15) 15 MEQ tablet Take 15 mEq by mouth 2 (two) times daily.   rivaroxaban (XARELTO) 20 MG TABS tablet Take 1 tablet (20 mg total) by mouth daily with supper.   Tragacanth (ASTRAGALUS ROOT) POWD Take  1 capsule by mouth 2 (two) times daily.    triamcinolone cream (KENALOG) 0.1 % Apply 1 application topically daily as needed.    zinc gluconate 50 MG tablet Take 50 mg by mouth daily.   [DISCONTINUED] Cholecalciferol 125 MCG (5000 UT) TABS Take by mouth.   [DISCONTINUED] finasteride (PROSCAR) 5 MG tablet Take 5 mg by mouth daily.   [DISCONTINUED] hydrochlorothiazide (HYDRODIURIL) 25 MG tablet TAKE 1 TABLET(25 MG) BY MOUTH DAILY   [DISCONTINUED] Potassium Citrate 15 MEQ (1620 MG) TBCR Take 1 tablet by mouth daily.     Allergies:   Penicillins, Benazepril, Codeine, and Prednisone   Social History   Socioeconomic History   Marital status: Married    Spouse name: Not on file   Number of children: Not on file   Years of education: Not on file   Highest education level: Not on file  Occupational History   Not on file  Tobacco Use   Smoking status: Former   Smokeless tobacco: Never  Substance and Sexual Activity   Alcohol use: Yes    Alcohol/week: 28.0 standard drinks of alcohol    Types: 7 Glasses of wine, 21 Cans of beer per week   Drug use: No   Sexual activity: Not on file  Other Topics Concern   Not on file  Social History Narrative   Not on file   Social Determinants of Health   Financial Resource Strain: Not on file  Food Insecurity: Not on file  Transportation Needs: Not on file  Physical Activity: Not on file  Stress: Not on file  Social Connections: Not on file     Family History: The patient's family history includes Cancer in his father and mother.  ROS:   Please see the history of present illness.    ROS  All other systems reviewed and negative.   EKGs/Labs/Other Studies Reviewed:    The following studies were reviewed today: PAp compliance download on [facility], EKG, outside labs from PAP  EKG:  EKG is not ordered today   Recent Labs: 06/18/2022: BUN 20; Creatinine, Ser 0.88; Hemoglobin 12.4; Platelets 294; Potassium 4.4; Sodium 137   Recent Lipid  Panel No results found for: "CHOL", "TRIG", "HDL", "CHOLHDL", "VLDL", "LDLCALC", "LDLDIRECT"  Physical Exam:    VS:  BP (!) 158/72   Pulse 72   Ht '5\' 8"'$  (1.727 m)   Wt 215 lb (97.5 kg)   SpO2 98%   BMI 32.69 kg/m     Wt Readings from Last 3 Encounters:  08/08/22 215 lb (97.5 kg)  06/18/22 210 lb (95.3 kg)  05/02/22 213 lb 6.4 oz (96.8 kg)    GEN: Well nourished, well developed in no acute distress HEENT: Normal NECK: No JVD; No carotid bruits LYMPHATICS: No lymphadenopathy CARDIAC:RRR, no murmurs, rubs, gallops RESPIRATORY:  Clear to  auscultation without rales, wheezing or rhonchi  ABDOMEN: Soft, non-tender, non-distended MUSCULOSKELETAL:  No edema; No deformity  SKIN: Warm and dry NEUROLOGIC:  Alert and oriented x 3 PSYCHIATRIC:  Normal affect    EKG was not  done today   ASSESSMENT:    1. OSA (obstructive sleep apnea)   2. Primary hypertension   3. PAF (paroxysmal atrial fibrillation) ([place])   4. Aortic atherosclerosis (HCC)    PLAN:    In order of problems listed above:  1. OSA - The patient is tolerating PAP therapy well without any problems. The PAP download performed by his DME was personally reviewed and interpreted by me today and showed an AHI of 0.4/hr on 16/12 cm H2O with 99% compliance in using more than 4 hours nightly.  The patient has been using and benefiting from PAP use and will continue to benefit from therapy.    2. HTN -BP controlled on exam today  -I have personally reviewed and interpreted outside labs performed by patient's PCP which showed SCr 0.85 and K+ 4.7 on 08/05/22 -continue prescription drug management with HCTZ '25mg'$  daily, Cardizem CD '240mg'$  daily and Losaratn '100mg'$  daily with PRN refills  3.  PAF -He remains in NSR and denies any palpitations or bleeding problems on DOAC -continue prescription drug management with Cardizem CD '240mg'$  daiyl, Flecainide '100mg'$  BID and Xarelto '20mg'$  daily with PRN refills -I have personally reviewed and  interpreted outside labs performed by patient's PCP which showed SCr 0.85, K+ 4.7  on 08/05/22 and Hbg 12.4 on 06/18/22   Medication Adjustments/Labs and Tests Ordered: Current medicines are reviewed at length with the patient today.  Concerns regarding medicines are outlined above.  No orders of the defined types were placed in this encounter.   No orders of the defined types were placed in this encounter.    Signed, [name], MD  08/08/2022 11:14 AM    [place]

## 2022-08-28 ENCOUNTER — Telehealth: Payer: Self-pay | Admitting: Cardiology

## 2022-08-28 NOTE — Telephone Encounter (Signed)
Patient calling to report BP readings:  1/29 11:00am  154/85  63  2:50pm    142/70  77 1/30 10am     157/78  59     152/71  66 1/31 10:50am  152/71  64 4:30pm    137/61  65 2/1      10am     148/82  61 6pm         148/74  65 2/2 10am       154/74  63 6pm         140/68  67 2/3      12pm        141/71 63 6pm         138/64 72 2/4      10am       144/75  66 5:30pm    144/68  61 2/5  12pm     142/75  67 6pm     142/69 69

## 2022-08-29 NOTE — Telephone Encounter (Signed)
Spoke to patient regarding Dr. Theodosia Blender advice to increase diltiazem to 300 mg. Patient states he has had difficulty with increasing his cardizem in the past (when he was hospitalized for afib several years ago) and would prefer to try taking his losartan in the morning (rather than at night as he usually does) to see if that would help his blood pressures. I provided education that this may not be enough of a change to help the overall trend of his numbers, patient stated he would prefer to try this and track his blood pressures for another week. He states that if his BP still is not improved he will consider increasing his diltiazem.

## 2022-09-02 NOTE — Telephone Encounter (Signed)
Called patient and left detailed message per DPR on voice mail. Reviewed Dr. Theodosia Blender advice to try hypertension medication regimen change and to take losartan in the morning, then track blood pressures for a week and call in with results.

## 2022-09-05 NOTE — Telephone Encounter (Signed)
Called patient to check on BP log. Patient reports the following blood pressures after starting to take losartan in the morning.  08/30/22:  150/77    08/31/22:  145/72  HR 63  09/01/22   136/71  HR 61  09/02/22   144/67  HR 68  09/03/22  137/64   HR 69  09/04/22   139/71  HR 66  09/05/22   136/67  HR 64

## 2022-09-10 NOTE — Telephone Encounter (Signed)
Reviewed Dr. Theodosia Blender recommendations to continue current BP regimen, patient verbalizes understanding.

## 2022-11-21 ENCOUNTER — Telehealth: Payer: Self-pay | Admitting: *Deleted

## 2022-11-21 NOTE — Telephone Encounter (Signed)
   Pre-operative Risk Assessment    Patient Name: Corey Hood  DOB: 02/27/1946 MRN: [id number]      Request for Surgical Clearance    Procedure:   BL L4-5 TFESI  Date of Surgery:  Clearance TBD                                 Surgeon:  [facility] Surgeon's Group or Practice Name:  DR. Weldon Inches Phone number:  [phone] Fax number:  [phone]   Type of Clearance Requested:   - Medical  - Pharmacy:  Hold Rivaroxaban (Xarelto) X'S 3 DAYS   Type of Anesthesia:  Not Indicated   Additional requests/questions:    Wilhemina Cash   11/21/2022, 3:52 PM

## 2022-11-24 ENCOUNTER — Telehealth: Payer: Self-pay

## 2022-11-24 NOTE — Telephone Encounter (Signed)
   Name: Corey Hood  DOB: 06/29/1946  MRN: [id number]  Primary Cardiologist: Armanda Magic, MD  Chart reviewed as part of pre-operative protocol coverage. Because of Corey Hood's past medical history and time since last visit, he will require a follow-up telephone visit in order to better assess preoperative cardiovascular risk.  Pre-op covering staff: - Please schedule appointment and call patient to inform them. If patient already had an upcoming appointment within acceptable timeframe, please add "pre-op clearance" to the appointment notes so provider is aware. - Please contact requesting surgeon's office via preferred method (i.e, phone, fax) to inform them of need for appointment prior to surgery.  Per office protocol, patient can hold Xarelto for 3 days prior to procedure.  Please resume when medically safe to do so.   Sharlene Dory, PA-C  11/24/2022, 4:14 PM

## 2022-11-24 NOTE — Telephone Encounter (Signed)
Spoke with patient who is agreeable to do a tele visit on 5/23 at 10:20 am. Med rec and consent have been done.

## 2022-11-24 NOTE — Telephone Encounter (Signed)
I tried to call the pt to set up tele pre op appt, but no answer 

## 2022-11-24 NOTE — Telephone Encounter (Signed)
  Patient Consent for Virtual Visit        Corey Hood has provided verbal consent on 11/24/2022 for a virtual visit (video or telephone).   CONSENT FOR VIRTUAL VISIT FOR:  Corey Hood  By participating in this virtual visit I agree to the following:  I hereby voluntarily request, consent and authorize [facility] and its employed or contracted physicians, physician assistants, nurse practitioners or other licensed health care professionals (the Practitioner), to provide me with telemedicine health care services (the "Services") as deemed necessary by the treating Practitioner. I acknowledge and consent to receive the Services by the Practitioner via telemedicine. I understand that the telemedicine visit will involve communicating with the Practitioner through live audiovisual communication technology and the disclosure of certain medical information by electronic transmission. I acknowledge that I have been given the opportunity to request an in-person assessment or other available alternative prior to the telemedicine visit and am voluntarily participating in the telemedicine visit.  I understand that I have the right to withhold or withdraw my consent to the use of telemedicine in the course of my care at any time, without affecting my right to future care or treatment, and that the Practitioner or I may terminate the telemedicine visit at any time. I understand that I have the right to inspect all information obtained and/or recorded in the course of the telemedicine visit and may receive copies of available information for a reasonable fee.  I understand that some of the potential risks of receiving the Services via telemedicine include:  Delay or interruption in medical evaluation due to technological equipment failure or disruption; Information transmitted may not be sufficient (e.g. poor resolution of images) to allow for appropriate medical decision making by the Practitioner;  and/or  In rare instances, security protocols could fail, causing a breach of personal health information.  Furthermore, I acknowledge that it is my responsibility to provide information about my medical history, conditions and care that is complete and accurate to the best of my ability. I acknowledge that Practitioner's advice, recommendations, and/or decision may be based on factors not within their control, such as incomplete or inaccurate data provided by me or distortions of diagnostic images or specimens that may result from electronic transmissions. I understand that the practice of medicine is not an exact science and that Practitioner makes no warranties or guarantees regarding treatment outcomes. I acknowledge that a copy of this consent can be made available to me via my patient portal [facility] MyChart), or I can request a printed copy by calling the office of [facility].    I understand that my insurance will be billed for this visit.   I have read or had this consent read to me. I understand the contents of this consent, which adequately explains the benefits and risks of the Services being provided via telemedicine.  I have been provided ample opportunity to ask questions regarding this consent and the Services and have had my questions answered to my satisfaction. I give my informed consent for the services to be provided through the use of telemedicine in my medical care

## 2022-11-24 NOTE — Telephone Encounter (Signed)
Patient with diagnosis of afib on Xarelto for anticoagulation.    Procedure: BL L4-5 ESI Date of procedure: TBD  CHA2DS2-VASc Score = 4  This indicates a 4.8% annual risk of stroke. The patient's score is based upon: CHF History: 0 HTN History: 1 Diabetes History: 0 Stroke History: 0 Vascular Disease History: 1 Age Score: 2 Gender Score: 0   CrCl 75mL/min using adj body weight Platelet count 421K  Per office protocol, patient can hold Xarelto for 3 days prior to procedure.    **This guidance is not considered finalized until pre-operative APP has relayed final recommendations.**

## 2022-12-03 NOTE — Progress Notes (Unsigned)
Virtual Visit via Telephone Note   Because of Corey Hood's co-morbid illnesses, he is at least at moderate risk for complications without adequate follow up.  This format is felt to be most appropriate for this patient at this time.  The patient did not have access to video technology/had technical difficulties with video requiring transitioning to audio format only (telephone).  All issues noted in this document were discussed and addressed.  No physical exam could be performed with this format.  Please refer to the patient's chart for his consent to telehealth for [facility].  Evaluation Performed:  Preoperative cardiovascular risk assessment _____________   Date:  12/03/2022   Patient ID:  Corey Hood, DOB 05-23-1946, MRN [id number] Patient Location:  Home Provider location:   Office  Primary Care Provider:  Simonne Maffucci, MD Primary Cardiologist:  Armanda Magic, MD  Chief Complaint / Patient Profile   77 y.o. 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  History of Present Illness    Corey Hood is a 77 y.o. male who presents via audio/video conferencing for a telehealth visit today.  Pt was last seen in cardiology clinic on 08/08/2022 by Dr. Mayford Knife.  At that time Corey Hood was doing well with no new cardiac complaints and tolerating his CPAP therapy..  The patient is now pending procedure as outlined above. Since his last visit, he ***  *** denies chest pain, shortness of breath, lower extremity edema, fatigue, palpitations, melena, hematuria, hemoptysis, diaphoresis, weakness, presyncope, syncope, orthopnea, and PND.    Per office protocol, patient can hold Xarelto for 3 days prior to procedure.    Past Medical History    Past Medical History:  Diagnosis Date   Aortic atherosclerosis (HCC)    Arthritis of both  knees    BPH (benign prostatic hyperplasia)    Hypertension    Obesity (BMI 30-39.9) 04/08/2016   OSA (obstructive sleep apnea) 03/30/2017   Moderate with AHI 21.3/hr with nocturnal hypoxemia by sleep study   Persistent atrial fibrillation (HCC)    s/p TEE/DCCV.  He is on Xarelto for a CHADS2VASC score 2   Past Surgical History:  Procedure Laterality Date   CARDIOVERSION N/A 12/09/2013   Procedure: CARDIOVERSION;  Surgeon: Laurey Morale, MD;  Location: [facility] ENDOSCOPY;  Service: Cardiovascular;  Laterality: N/A;   CARDIOVERSION N/A 05/14/2015   Procedure: CARDIOVERSION;  Surgeon: Chrystie Nose, MD;  Location: [facility] ENDOSCOPY;  Service: Cardiovascular;  Laterality: N/A;   CARDIOVERSION N/A 02/16/2018   Procedure: CARDIOVERSION;  Surgeon: Chrystie Nose, MD;  Location: [facility] ENDOSCOPY;  Service: Cardiovascular;  Laterality: N/A;   CERVICAL SPINE SURGERY     TEE WITHOUT CARDIOVERSION N/A 12/09/2013   Procedure: TRANSESOPHAGEAL ECHOCARDIOGRAM (TEE);  Surgeon: Laurey Morale, MD;  Location: [facility] ENDOSCOPY;  Service: Cardiovascular;  Laterality: N/A;   TEE WITHOUT CARDIOVERSION N/A 05/14/2015   Procedure: TRANSESOPHAGEAL ECHOCARDIOGRAM (TEE);  Surgeon: Chrystie Nose, MD;  Location: [facility] ENDOSCOPY;  Service: Cardiovascular;  Laterality: N/A;   TEE WITHOUT CARDIOVERSION N/A 02/16/2018   Procedure: TRANSESOPHAGEAL ECHOCARDIOGRAM (TEE);  Surgeon: Chrystie Nose, MD;  Location: [facility] ENDOSCOPY;  Service: Cardiovascular;  Laterality: N/A;    Allergies  Allergies  Allergen Reactions   Penicillins Hives and Other (See Comments)    Has patient had a PCN reaction causing immediate rash, facial/tongue/throat swelling, SOB or  lightheadedness with hypotension: Unknown Has patient had a PCN reaction causing severe rash involving mucus membranes or skin necrosis: No Has patient had a PCN reaction that required hospitalization: No Has patient had a PCN reaction occurring within the last 10 years: No If all of the  above answers are "NO", then may proceed with Cephalosporin use.  Has patient had a PCN reaction causing immediate rash, facial/tongue/throat swelling, SOB or lightheadedness with hypotension: Unknown Has patient had a PCN reaction causing severe rash involving mucus membranes or skin necrosis: No Has patient had a PCN reaction that required hospitalization: No Has patient had a PCN reaction occurring within the last 10 years: No If all of the above answers are "NO", then may proceed with Cephalosporin use.   Benazepril Cough and Other (See Comments)   Codeine Other (See Comments)    "makes me loopy" "makes me loopy" "makes me loopy" "makes me loopy" "makes me loopy" Other reaction(s): Other (See Comments) "makes me loopy"   Prednisone Other (See Comments)    States "not taking anymore" States "not taking anymore" States "not taking anymore"    Home Medications    Prior to Admission medications   Medication Sig Start Date End Date Taking? Authorizing Provider  Coenzyme Q10 (COQ10) 100 MG CAPS Take 100 mg by mouth 2 (two) times daily.     [provider]  diltiazem (TIAZAC) 240 MG 24 hr capsule Take 240 mg by mouth daily.    [provider]  flecainide (TAMBOCOR) 100 MG tablet Take 1 tablet (100 mg total) by mouth every 12 (twelve) hours. 04/08/16   Quintella Reichert, MD  HYDROcodone-acetaminophen (NORCO/VICODIN) 5-325 MG tablet Take 1 tablet by mouth every 6 (six) hours as needed.    [provider]  Boris Lown Oil 500 MG CAPS Take 500 mg by mouth daily.     [provider]  losartan (COZAAR) 100 MG tablet TAKE 1 TABLET(100 MG) BY MOUTH DAILY. 05/12/22   Quintella Reichert, MD  Magnesium 400 MG TABS Take 400 mg by mouth daily.     [provider]  Multiple Vitamin (MULTIVITAMIN WITH MINERALS) TABS tablet Take 2 tablets by mouth daily.     [provider]  OVER THE COUNTER MEDICATION Take 1 capsule by mouth 2 (two) times daily. Livaplex  Supplement    [provider]  potassium chloride SA (KLOR-CON M15) 15 MEQ tablet Take 15 mEq by mouth 2 (two) times daily.    [provider]  rivaroxaban (XARELTO) 20 MG TABS tablet Take 1 tablet (20 mg total) by mouth daily with supper. 07/18/14   Quintella Reichert, MD  Tragacanth (ASTRAGALUS ROOT) POWD Take 1 capsule by mouth 2 (two) times daily.     [provider]  triamcinolone cream (KENALOG) 0.1 % Apply 1 application topically daily as needed.  08/10/19   [provider]  zinc gluconate 50 MG tablet Take 50 mg by mouth daily.    [provider]    Physical Exam    Vital Signs:  Corey Hood does not have vital signs available for review today.***  Given telephonic nature of communication, physical exam is limited. AAOx3. NAD. Normal affect.  Speech and respirations are unlabored.  Accessory Clinical Findings    None  Assessment & Plan    1.  Preoperative Cardiovascular Risk Assessment: -Patient's RCRI score is 0.4%  The patient was advised that if he develops new symptoms prior to surgery to contact our office  to arrange for a follow-up visit, and he verbalized understanding.  (Reminder: Include SBE prophylaxis/Antiplatelet/Anticoag Instructions***)  A copy of this note will be routed to requesting surgeon.  Time:   Today, I have spent *** minutes with the patient with telehealth technology discussing medical history, symptoms, and management plan.     Napoleon Form, Leodis Rains, NP  12/03/2022, 2:56 PM

## 2022-12-04 ENCOUNTER — Ambulatory Visit: Payer: Medicare HMO | Attending: Interventional Cardiology

## 2022-12-04 DIAGNOSIS — Z01810 Encounter for preprocedural cardiovascular examination: Secondary | ICD-10-CM

## 2023-02-24 ENCOUNTER — Other Ambulatory Visit: Payer: Self-pay | Admitting: Cardiology

## 2023-02-27 ENCOUNTER — Telehealth: Payer: Self-pay

## 2023-02-27 NOTE — Telephone Encounter (Signed)
   Pre-operative Risk Assessment    Patient Name: Corey Hood  DOB: 09-02-1945 MRN: [id number]      Request for Surgical Clearance    Procedure:   BL l4-5 TFESI  Date of Surgery:  Clearance TBD                                 Surgeon:  Peggye Pitt, MD Surgeon's Group or Practice Name:  [facility] Phone number:  [phone] Fax number:  [phone]   Type of Clearance Requested:   - Medical  - Pharmacy:  Hold Rivaroxaban (Xarelto) x 3 days   Type of Anesthesia:  Not Indicated   Additional requests/questions:    Garrel Ridgel   02/27/2023, 4:05 PM

## 2023-03-02 ENCOUNTER — Telehealth: Payer: Self-pay

## 2023-03-02 NOTE — Telephone Encounter (Signed)
Pt is scheduled for tele appt 09/17 at 10:20am. Med rec and consent done    Patient Consent for Virtual Visit        Corey Hood has provided verbal consent on 03/02/2023 for a virtual visit (video or telephone).   CONSENT FOR VIRTUAL VISIT FOR:  Corey Hood  By participating in this virtual visit I agree to the following:  I hereby voluntarily request, consent and authorize [facility] and its employed or contracted physicians, physician assistants, nurse practitioners or other licensed health care professionals (the Practitioner), to provide me with telemedicine health care services (the "Services") as deemed necessary by the treating Practitioner. I acknowledge and consent to receive the Services by the Practitioner via telemedicine. I understand that the telemedicine visit will involve communicating with the Practitioner through live audiovisual communication technology and the disclosure of certain medical information by electronic transmission. I acknowledge that I have been given the opportunity to request an in-person assessment or other available alternative prior to the telemedicine visit and am voluntarily participating in the telemedicine visit.  I understand that I have the right to withhold or withdraw my consent to the use of telemedicine in the course of my care at any time, without affecting my right to future care or treatment, and that the Practitioner or I may terminate the telemedicine visit at any time. I understand that I have the right to inspect all information obtained and/or recorded in the course of the telemedicine visit and may receive copies of available information for a reasonable fee.  I understand that some of the potential risks of receiving the Services via telemedicine include:  Delay or interruption in medical evaluation due to technological equipment failure or disruption; Information transmitted may not be sufficient (e.g. poor resolution of images)  to allow for appropriate medical decision making by the Practitioner; and/or  In rare instances, security protocols could fail, causing a breach of personal health information.  Furthermore, I acknowledge that it is my responsibility to provide information about my medical history, conditions and care that is complete and accurate to the best of my ability. I acknowledge that Practitioner's advice, recommendations, and/or decision may be based on factors not within their control, such as incomplete or inaccurate data provided by me or distortions of diagnostic images or specimens that may result from electronic transmissions. I understand that the practice of medicine is not an exact science and that Practitioner makes no warranties or guarantees regarding treatment outcomes. I acknowledge that a copy of this consent can be made available to me via my patient portal [facility] MyChart), or I can request a printed copy by calling the office of [facility].    I understand that my insurance will be billed for this visit.   I have read or had this consent read to me. I understand the contents of this consent, which adequately explains the benefits and risks of the Services being provided via telemedicine.  I have been provided ample opportunity to ask questions regarding this consent and the Services and have had my questions answered to my satisfaction. I give my informed consent for the services to be provided through the use of telemedicine in my medical care

## 2023-03-02 NOTE — Telephone Encounter (Signed)
   Name: Corey Hood  DOB: 06-18-1946  MRN: [id number]  Primary Cardiologist: Armanda Magic, MD   Preoperative team, please contact this patient and set up a phone call appointment for further preoperative risk assessment. Please obtain consent and complete medication review. Thank you for your help.  I confirm that guidance regarding antiplatelet and oral anticoagulation therapy has been completed and, if necessary, noted below.   Per office protocol, patient can hold Xarelto  for 3 days prior to procedure.   Ronney Asters, NP 03/02/2023, 10:56 AM [facility]

## 2023-03-02 NOTE — Telephone Encounter (Signed)
Patient with diagnosis of A Fib on Xarelto for anticoagulation.    Procedure:  BL l4-5 TFESI  Date of procedure: TBD   CHA2DS2-VASc Score = 4  This indicates a 4.8% annual risk of stroke. The patient's score is based upon: CHF History: 0 HTN History: 1 Diabetes History: 0 Stroke History: 0 Vascular Disease History: 1 Age Score: 2 Gender Score: 0  CrCl 82 mL/min Platelet count 421K   Per office protocol, patient can hold Xarelto  for 3 days prior to procedure.    **This guidance is not considered finalized until pre-operative APP has relayed final recommendations.**

## 2023-03-02 NOTE — Telephone Encounter (Signed)
Pt is scheduled for tele appt 09/17 at 10:20am. Med rec and consent done

## 2023-03-03 NOTE — Telephone Encounter (Signed)
OUR OFFICE RECEIVED A DUPLICATE REQUEST STATING 3RD ATTEMPT. Our office received the request on 02/27/23 and the pt has been called and scheduled for a tele pre op appt 03/31/23. I will update the requesting office that once the pt is cleared we will fax over the clearance. [facility] prides ourselves on caring for our pt's to the very best care. This request has been in process already and pt has tele visit upcoming for pre op clearance.

## 2023-03-31 ENCOUNTER — Ambulatory Visit: Payer: Medicare HMO | Attending: Physician Assistant

## 2023-03-31 DIAGNOSIS — Z01810 Encounter for preprocedural cardiovascular examination: Secondary | ICD-10-CM

## 2023-03-31 NOTE — Progress Notes (Signed)
Virtual Visit via Telephone Note   Because of Corey Hood's co-morbid illnesses, he is at least at moderate risk for complications without adequate follow up.  This format is felt to be most appropriate for this patient at this time.  The patient did not have access to video technology/had technical difficulties with video requiring transitioning to audio format only (telephone).  All issues noted in this document were discussed and addressed.  No physical exam could be performed with this format.  Please refer to the patient's chart for his consent to telehealth for [facility].  Evaluation Performed:  Preoperative cardiovascular risk assessment _____________   Date:  03/31/2023   Patient ID:  Corey Hood, DOB [birth year], MRN [id number] Patient Location:  Home Provider location:   Office  Primary Care Provider:  Simonne Maffucci, MD Primary Cardiologist:  Armanda Magic, MD  Chief Complaint / Patient Profile   77 y.o. y/o male with a h/o persistent AF s/p TEE/DCCV 04/2015 and 2019, HTN, and moderate OSA who is pending BL 4-5 TFESI and presents today for telephonic preoperative cardiovascular risk assessment.  History of Present Illness    Corey Hood is a 77 y.o. male who presents via audio/video conferencing for a telehealth visit today.  Pt was last seen in cardiology clinic on 12/04/22 by Corey Balsam, NP.  At that time Corey Hood was doing well .  The patient is now pending procedure as outlined above. Since his last visit, he  has been feeling fine. No chest pain or SOB, no irregular HR. No further issues with Afib.   He fell down a few weeks ago in the yard and he had both hips replaced. More of a mechanical fall. He is going today to discuss this with the orthopedic doctor.  Prior to his fall he was doing great, pool three times a week and hydrotherapy and that was keeping him going. He has been in a recliner. He has not left his house. For this reason he did not meet 4  mets.   Patient can hold Xarelto 3 days prior to procedure and should restart when safest possible postprocedure.    Past Medical History    Past Medical History:  Diagnosis Date   Aortic atherosclerosis (HCC)    Arthritis of both knees    BPH (benign prostatic hyperplasia)    Hypertension    Obesity (BMI 30-39.9) 04/08/2016   OSA (obstructive sleep apnea) 03/30/2017   Moderate with AHI 21.3/hr with nocturnal hypoxemia by sleep study   Persistent atrial fibrillation (HCC)    s/p TEE/DCCV.  He is on Xarelto for a CHADS2VASC score 2   Past Surgical History:  Procedure Laterality Date   CARDIOVERSION N/A 12/09/2013   Procedure: CARDIOVERSION;  Surgeon: Laurey Morale, MD;  Location: [facility] ENDOSCOPY;  Service: Cardiovascular;  Laterality: N/A;   CARDIOVERSION N/A 05/14/2015   Procedure: CARDIOVERSION;  Surgeon: Chrystie Nose, MD;  Location: [facility] ENDOSCOPY;  Service: Cardiovascular;  Laterality: N/A;   CARDIOVERSION N/A 02/16/2018   Procedure: CARDIOVERSION;  Surgeon: Chrystie Nose, MD;  Location: [facility] ENDOSCOPY;  Service: Cardiovascular;  Laterality: N/A;   CERVICAL SPINE SURGERY     TEE WITHOUT CARDIOVERSION N/A 12/09/2013   Procedure: TRANSESOPHAGEAL ECHOCARDIOGRAM (TEE);  Surgeon: Laurey Morale, MD;  Location: [facility] ENDOSCOPY;  Service: Cardiovascular;  Laterality: N/A;   TEE WITHOUT CARDIOVERSION N/A 05/14/2015   Procedure: TRANSESOPHAGEAL ECHOCARDIOGRAM (TEE);  Surgeon: Chrystie Nose, MD;  Location: [facility] ENDOSCOPY;  Service: Cardiovascular;  Laterality: N/A;   TEE WITHOUT CARDIOVERSION N/A 02/16/2018   Procedure: TRANSESOPHAGEAL ECHOCARDIOGRAM (TEE);  Surgeon: Chrystie Nose, MD;  Location: [facility] ENDOSCOPY;  Service: Cardiovascular;  Laterality: N/A;    Allergies  Allergies  Allergen Reactions   Penicillins Hives and Other (See Comments)    Has patient had a PCN reaction causing immediate rash, facial/tongue/throat swelling, SOB or lightheadedness with hypotension: Unknown Has  patient had a PCN reaction causing severe rash involving mucus membranes or skin necrosis: No Has patient had a PCN reaction that required hospitalization: No Has patient had a PCN reaction occurring within the last 10 years: No If all of the above answers are "NO", then may proceed with Cephalosporin use.  Has patient had a PCN reaction causing immediate rash, facial/tongue/throat swelling, SOB or lightheadedness with hypotension: Unknown Has patient had a PCN reaction causing severe rash involving mucus membranes or skin necrosis: No Has patient had a PCN reaction that required hospitalization: No Has patient had a PCN reaction occurring within the last 10 years: No If all of the above answers are "NO", then may proceed with Cephalosporin use.   Benazepril Cough and Other (See Comments)   Codeine Other (See Comments)    "makes me loopy" "makes me loopy" "makes me loopy" "makes me loopy" "makes me loopy" Other reaction(s): Other (See Comments) "makes me loopy"   Prednisone Other (See Comments)    States "not taking anymore" States "not taking anymore" States "not taking anymore"    Home Medications    Prior to Admission medications   Medication Sig Start Date End Date Taking? Authorizing Provider  Coenzyme Q10 (COQ10) 100 MG CAPS Take 100 mg by mouth 2 (two) times daily.     [provider]  diltiazem (TIAZAC) 240 MG 24 hr capsule Take 240 mg by mouth daily.    [provider]  flecainide (TAMBOCOR) 100 MG tablet Take 1 tablet (100 mg total) by mouth every 12 (twelve) hours. 04/08/16   Quintella Reichert, MD  HYDROcodone-acetaminophen (NORCO/VICODIN) 5-325 MG tablet Take 1 tablet by mouth every 6 (six) hours as needed.    [provider]  Boris Lown Oil 500 MG CAPS Take 500 mg by mouth daily.     [provider]  losartan (COZAAR) 100 MG tablet TAKE 1 TABLET(100 MG) BY MOUTH DAILY. 02/24/23   Quintella Reichert, MD  Magnesium 400 MG TABS Take 400 mg by mouth  daily.     [provider]  Multiple Vitamin (MULTIVITAMIN WITH MINERALS) TABS tablet Take 2 tablets by mouth daily.     [provider]  OVER THE COUNTER MEDICATION Take 1 capsule by mouth 2 (two) times daily. Livaplex Supplement    [provider]  potassium chloride SA (KLOR-CON M15) 15 MEQ tablet Take 15 mEq by mouth 2 (two) times daily.    [provider]  rivaroxaban (XARELTO) 20 MG TABS tablet Take 1 tablet (20 mg total) by mouth daily with supper. 07/18/14   Quintella Reichert, MD  Tragacanth (ASTRAGALUS ROOT) POWD Take 1 capsule by mouth 2 (two) times daily.     [provider]  triamcinolone cream (KENALOG) 0.1 % Apply 1 application topically daily as needed.  08/10/19   [provider]  zinc gluconate 50 MG tablet Take 50 mg by mouth daily.    [provider]    Physical Exam    Vital Signs:  Corey Hood does not have vital signs available  for review today.  135/75   Given telephonic nature of communication, physical exam is limited. AAOx3. NAD. Normal affect.  Speech and respirations are unlabored.  Accessory Clinical Findings    None  Assessment & Plan    1.  Preoperative Cardiovascular Risk Assessment:  Corey Hood perioperative risk of a major cardiac event is 0.9% according to the Revised Cardiac Risk Index (RCRI).  Therefore, he is at low risk for perioperative complications.   His functional capacity is poor at 3.63 METs according to the Duke Activity Status Index (DASI). Recommendations: According to ACC/AHA guidelines, no further cardiovascular testing needed.  The patient may proceed to surgery at acceptable risk.   Antiplatelet and/or Anticoagulation Recommendations:  Xarelto (Rivaroxaban) can be held for 3 days prior to surgery.  Please resume post op when felt to be safe.     Time:   Today, I have spent 17 minutes with the patient with telehealth technology discussing medical history, symptoms, and  management plan.     Sharlene Dory, PA-C  03/31/2023, 10:19 AM

## 2023-07-30 LAB — LAB REPORT - SCANNED
A1c: 6.4
EGFR: 82

## 2023-08-11 ENCOUNTER — Ambulatory Visit: Payer: Medicare HMO | Attending: Cardiology | Admitting: Cardiology

## 2023-08-11 ENCOUNTER — Encounter: Payer: Self-pay | Admitting: Cardiology

## 2023-08-11 ENCOUNTER — Other Ambulatory Visit: Payer: Self-pay

## 2023-08-11 VITALS — BP 162/74 | HR 86 | Ht 68.0 in | Wt 221.2 lb

## 2023-08-11 DIAGNOSIS — I10 Essential (primary) hypertension: Secondary | ICD-10-CM

## 2023-08-11 DIAGNOSIS — Z79899 Other long term (current) drug therapy: Secondary | ICD-10-CM

## 2023-08-11 DIAGNOSIS — I480 Paroxysmal atrial fibrillation: Secondary | ICD-10-CM | POA: Diagnosis not present

## 2023-08-11 DIAGNOSIS — G4733 Obstructive sleep apnea (adult) (pediatric): Secondary | ICD-10-CM | POA: Diagnosis not present

## 2023-08-11 MED ORDER — IRBESARTAN 300 MG PO TABS: 300.0000 mg | ORAL_TABLET | Freq: Every day | ORAL | 3 refills | Status: DC

## 2023-08-11 NOTE — Patient Instructions (Signed)
Medication Instructions:  Please STOP taking losartan.  Please START taking irbesartan (Avapro) 300 mg every day.  *If you need a refill on your cardiac medications before your next appointment, please call your pharmacy*   Lab Work: Please complete a BMET in one week at any [facility]. You do not need to be fasting for this test.  If you have labs (blood work) drawn today and your tests are completely normal, you will receive your results only by: MyChart Message (if you have MyChart) OR A paper copy in the mail If you have any lab test that is abnormal or we need to change your treatment, we will call you to review the results.   Testing/Procedures: None.   Follow-Up:  Your next appointment:   1 year(s)  Provider:   Armanda Magic, MD     Other Instructions Dr. Mayford Knife has referred you to our hypertension clinic. She would like you to see one of our pharmacists for a blood pressure check in 2-3 weeks.

## 2023-08-11 NOTE — Addendum Note (Signed)
Addended by: Luellen Pucker on: 08/11/2023 03:53 PM   Modules accepted: Orders

## 2023-08-11 NOTE — Progress Notes (Signed)
Cardiology Office Note:    Date:  08/11/2023   ID:  Corey Hood, DOB April 19, 1946, MRN [id number]  PCP:  Simonne Maffucci, MD  Cardiologist:  Armanda Magic, MD    Referring MD: Simonne Maffucci, MD   Chief Complaint  Patient presents with   Sleep Apnea   Atrial Fibrillation   Hypertension    History of Present Illness:    Corey Hood is a 78 y.o. male with a hx of persistent AF s/p TEE/DCCV 04/2015, HTN and moderate OSA with AHI 21.3/hr with nocturnal hypoxemia and oxygen desaturations as low as 70%.  He did not tolerate CPAP due to ongoing respiratory events and underwent BiPAP titration to 16/12cm H2O.    He is here today for followup and is doing well.  He denies any chest pain or pressure,  PND, orthopnea, LE edema, dizziness, palpitations or syncope.  He has chronic DOE that he attributes to being overweight.  It mainly occurs when walking up a hill and not with exercise in the pool.  He is compliant with his meds and is tolerating meds with no SE.    He is doing well with his PAP device and thinks that has gotten used to it.  He tolerates the full face mask but it bothers him over the bridge of his nose. He feels the pressure is adequate.  Since going on PAP he feels rested in the am and has no significant daytime sleepiness.  He has problems with mouth and nose dryness but no significant nasal congestion.  He does not think that he snores.    Past Medical History:  Diagnosis Date   Aortic atherosclerosis (HCC)    Arthritis of both knees    BPH (benign prostatic hyperplasia)    Hypertension    Obesity (BMI 30-39.9) 04/08/2016   OSA (obstructive sleep apnea) 03/30/2017   Moderate with AHI 21.3/hr with nocturnal hypoxemia by sleep study   Persistent atrial fibrillation (HCC)    s/p TEE/DCCV.  He is on Xarelto for a CHADS2VASC score 2    Past Surgical History:  Procedure Laterality Date   CARDIOVERSION N/A 12/09/2013   Procedure: CARDIOVERSION;  Surgeon: Laurey Morale, MD;   Location: [facility] ENDOSCOPY;  Service: Cardiovascular;  Laterality: N/A;   CARDIOVERSION N/A 05/14/2015   Procedure: CARDIOVERSION;  Surgeon: Chrystie Nose, MD;  Location: [facility] ENDOSCOPY;  Service: Cardiovascular;  Laterality: N/A;   CARDIOVERSION N/A 02/16/2018   Procedure: CARDIOVERSION;  Surgeon: Chrystie Nose, MD;  Location: [facility] ENDOSCOPY;  Service: Cardiovascular;  Laterality: N/A;   CERVICAL SPINE SURGERY     TEE WITHOUT CARDIOVERSION N/A 12/09/2013   Procedure: TRANSESOPHAGEAL ECHOCARDIOGRAM (TEE);  Surgeon: Laurey Morale, MD;  Location: [facility] ENDOSCOPY;  Service: Cardiovascular;  Laterality: N/A;   TEE WITHOUT CARDIOVERSION N/A 05/14/2015   Procedure: TRANSESOPHAGEAL ECHOCARDIOGRAM (TEE);  Surgeon: Chrystie Nose, MD;  Location: [facility] ENDOSCOPY;  Service: Cardiovascular;  Laterality: N/A;   TEE WITHOUT CARDIOVERSION N/A 02/16/2018   Procedure: TRANSESOPHAGEAL ECHOCARDIOGRAM (TEE);  Surgeon: Chrystie Nose, MD;  Location: [facility] ENDOSCOPY;  Service: Cardiovascular;  Laterality: N/A;    Current Medications: Current Meds  Medication Sig   Coenzyme Q10 (COQ10) 100 MG CAPS Take 100 mg by mouth 2 (two) times daily.    diltiazem (TIAZAC) 240 MG 24 hr capsule Take 240 mg by mouth daily.   flecainide (TAMBOCOR) 100 MG tablet Take 1 tablet (100 mg total) by mouth every 12 (twelve) hours.   Krill Oil 500 MG  CAPS Take 500 mg by mouth daily.    losartan (COZAAR) 100 MG tablet TAKE 1 TABLET(100 MG) BY MOUTH DAILY.   Magnesium 400 MG TABS Take 400 mg by mouth daily.    Multiple Vitamin (MULTIVITAMIN WITH MINERALS) TABS tablet Take 2 tablets by mouth daily.    OVER THE COUNTER MEDICATION Take 1 capsule by mouth 2 (two) times daily. Livaplex Supplement   potassium chloride SA (KLOR-CON M15) 15 MEQ tablet Take 15 mEq by mouth 2 (two) times daily.   rivaroxaban (XARELTO) 20 MG TABS tablet Take 1 tablet (20 mg total) by mouth daily with supper.   Tragacanth (ASTRAGALUS ROOT) POWD Take 1 capsule by mouth 2 (two)  times daily.    zinc gluconate 50 MG tablet Take 50 mg by mouth daily.     Allergies:   Penicillins, Benazepril, Codeine, and Prednisone   Social History   Socioeconomic History   Marital status: Married    Spouse name: Not on file   Number of children: Not on file   Years of education: Not on file   Highest education level: Not on file  Occupational History   Not on file  Tobacco Use   Smoking status: Former   Smokeless tobacco: Never  Substance and Sexual Activity   Alcohol use: Yes    Alcohol/week: 28.0 standard drinks of alcohol    Types: 7 Glasses of wine, 21 Cans of beer per week   Drug use: No   Sexual activity: Not on file  Other Topics Concern   Not on file  Social History Narrative   Not on file   Social Drivers of Health   Financial Resource Strain: Low Risk  (09/30/2022)   Received from [facility]   Overall Financial Resource Strain (CARDIA)    Difficulty of Paying Living Expenses: Not very hard  Food Insecurity: No Food Insecurity (09/30/2022)   Received from [facility], [facility]   Hunger Vital Sign    Worried About Running Out of Food in the Last Year: Never true    Ran Out of Food in the Last Year: Never true  Transportation Needs: No Transportation Needs (09/30/2022)   Received from [facility], [facility]   PRAPARE - Transportation    Lack of Transportation (Medical): No    Lack of Transportation (Non-Medical): No  Physical Activity: Unknown (09/30/2022)   Received from [facility], [facility]   Exercise Vital Sign    Days of Exercise per Week: 3 days    Minutes of Exercise per Session: Not on file  Stress: Stress Concern Present (09/30/2022)   Received from [facility] - Occupational Stress Questionnaire    Feeling of Stress : To some extent  Social Connections: Moderately Integrated (09/30/2022)   Received from [facility], [facility]   Social  Network    How would you rate your social network (family, work, friends)?: Adequate participation with social networks     Family History: The patient's family history includes Cancer in his father and mother.  ROS:   Please see the history of present illness.    ROS  All other systems reviewed and negative.   EKGs/Labs/Other Studies Reviewed:    The following studies were reviewed today: PAp compliance download on Airview, EKG, outside labs from PAP   Recent Labs: No results found for requested labs within last 365 days.   Recent Lipid Panel No results found for: "CHOL", "  TRIG", "HDL", "CHOLHDL", "VLDL", "LDLCALC", "LDLDIRECT"  Physical Exam:    VS:  BP (!) 162/74 (BP Location: Left Arm, Patient Position: Sitting, Cuff Size: Large)   Pulse 86   Ht 5\' 8"  (1.727 m)   Wt 221 lb 3.2 oz (100.3 kg)   SpO2 97%   BMI 33.63 kg/m     Wt Readings from Last 3 Encounters:  08/11/23 221 lb 3.2 oz (100.3 kg)  08/08/22 215 lb (97.5 kg)  06/18/22 210 lb (95.3 kg)    GEN: Well nourished, well developed in no acute distress HEENT: Normal NECK: No JVD; No carotid bruits LYMPHATICS: No lymphadenopathy CARDIAC:RRR, no murmurs, rubs, gallops RESPIRATORY:  Clear to auscultation without rales, wheezing or rhonchi  ABDOMEN: Soft, non-tender, non-distended MUSCULOSKELETAL:  trace edema; No deformity  SKIN: Warm and dry NEUROLOGIC:  Alert and oriented x 3 PSYCHIATRIC:  Normal affect  ASSESSMENT:    1. OSA (obstructive sleep apnea)   2. Primary hypertension   3. PAF (paroxysmal atrial fibrillation) (HCC)     PLAN:    In order of problems listed above:  1. OSA - The patient is tolerating PAP therapy well without any problems. The PAP download performed by his DME was personally reviewed and interpreted by me today and showed an AHI of 1.2/hr on auto BiPAP cm H2O with 98% compliance in using more than 4 hours nightly.  The patient has been using and benefiting from PAP use and will  continue to benefit from therapy.  -he complains of the FFM hurting the bridge of his nose so I will order him an under the nose FFM   2. HTN -BP borderline controlled on exam today but brought in a weeks worth of BP readings today showing a range of 133-155/64-54mmHg -I have personally reviewed and interpreted outside labs performed by patient's PCP which showed SCR 0.95, K+ 4.2 on 07/30/2023 -Continue prescription drug management with diltiazem 240 mg daily with as needed refills -he was started on Spironolactone 25mg  daily but BP is still elevated>>continue at current dose -he stopped hydrochlorothiazide due to kidney stones -change Losartan to Irbesartan 300mg  daily for better BP control -check BMET in 1 week  3.  PAF -He remains in normal sinus rhythm on exam today denies any palpitations  -No bleeding problems on DOAC  -Continue prescription drug management with diltiazem 240 mg daily, Xarelto 20 mg daily with as needed refills -I have personally reviewed and interpreted outside labs performed by patient's PCP which showed Hbg 13 on 07/30/2023  Followup with me in 1 year   Medication Adjustments/Labs and Tests Ordered: Current medicines are reviewed at length with the patient today.  Concerns regarding medicines are outlined above.  Orders Placed This Encounter  Procedures   EKG 12-Lead    No orders of the defined types were placed in this encounter.    Signed, Armanda Magic, MD  08/11/2023 3:36 PM    [facility]

## 2023-09-09 LAB — BASIC METABOLIC PANEL
BUN/Creatinine Ratio: 19 (ref 10–24)
BUN: 17 mg/dL (ref 8–27)
CO2: 23 mmol/L (ref 20–29)
Calcium: 9.3 mg/dL (ref 8.6–10.2)
Chloride: 105 mmol/L (ref 96–106)
Creatinine, Ser: 0.9 mg/dL (ref 0.76–1.27)
Glucose: 102 mg/dL — ABNORMAL HIGH (ref 70–99)
Potassium: 5.3 mmol/L — ABNORMAL HIGH (ref 3.5–5.2)
Sodium: 141 mmol/L (ref 134–144)
eGFR: 88 mL/min/{1.73_m2} (ref >59)

## 2023-09-10 ENCOUNTER — Other Ambulatory Visit: Payer: Self-pay | Admitting: *Deleted

## 2023-09-10 DIAGNOSIS — E875 Hyperkalemia: Secondary | ICD-10-CM

## 2023-09-16 LAB — BASIC METABOLIC PANEL
BUN/Creatinine Ratio: 22 (ref 10–24)
BUN: 17 mg/dL (ref 8–27)
CO2: 20 mmol/L (ref 20–29)
Calcium: 9.2 mg/dL (ref 8.6–10.2)
Chloride: 106 mmol/L (ref 96–106)
Creatinine, Ser: 0.76 mg/dL (ref 0.76–1.27)
Glucose: 94 mg/dL (ref 70–99)
Potassium: 5 mmol/L (ref 3.5–5.2)
Sodium: 141 mmol/L (ref 134–144)
eGFR: 93 mL/min/{1.73_m2} (ref >59)

## 2023-09-24 ENCOUNTER — Telehealth: Payer: Self-pay | Admitting: Pharmacy Technician

## 2023-09-24 ENCOUNTER — Encounter: Payer: Self-pay | Admitting: Pharmacist

## 2023-09-24 ENCOUNTER — Ambulatory Visit: Payer: Medicare HMO | Attending: Cardiovascular Disease | Admitting: Pharmacist

## 2023-09-24 ENCOUNTER — Other Ambulatory Visit (HOSPITAL_COMMUNITY): Payer: Self-pay

## 2023-09-24 ENCOUNTER — Telehealth: Payer: Self-pay | Admitting: Pharmacist

## 2023-09-24 VITALS — BP 128/57 | HR 85

## 2023-09-24 DIAGNOSIS — I10 Essential (primary) hypertension: Secondary | ICD-10-CM

## 2023-09-24 NOTE — Patient Instructions (Signed)
 No Changes made by your pharmacist Carmela Hurt, PharmD at today's visit:     Bring all of your meds, your BP cuff and your record of home blood pressures to your next appointment.    HOW TO TAKE YOUR BLOOD PRESSURE AT HOME  Rest 5 minutes before taking your blood pressure.  Don't smoke or drink caffeinated beverages for at least 30 minutes before. Take your blood pressure before (not after) you eat. Sit comfortably with your back supported and both feet on the floor (don't cross your legs). Elevate your arm to heart level on a table or a desk. Use the proper sized cuff. It should fit smoothly and snugly around your bare upper arm. There should be enough room to slip a fingertip under the cuff. The bottom edge of the cuff should be 1 inch above the crease of the elbow. Ideally, take 3 measurements at one sitting and record the average.  Important lifestyle changes to control high blood pressure  Intervention  Effect on the BP  Lose extra pounds and watch your waistline Weight loss is one of the most effective lifestyle changes for controlling blood pressure. If you're overweight or obese, losing even a small amount of weight can help reduce blood pressure. Blood pressure might go down by about 1 millimeter of mercury (mm Hg) with each kilogram (about 2.2 pounds) of weight lost.  Exercise regularly As a general goal, aim for at least 30 minutes of moderate physical activity every Vore. Regular physical activity can lower high blood pressure by about 5 to 8 mm Hg.  Eat a healthy diet Eating a diet rich in whole grains, fruits, vegetables, and low-fat dairy products and low in saturated fat and cholesterol. A healthy diet can lower high blood pressure by up to 11 mm Hg.  Reduce salt (sodium) in your diet Even a small reduction of sodium in the diet can improve heart health and reduce high blood pressure by about 5 to 6 mm Hg.  Limit alcohol One drink equals 12 ounces of beer, 5 ounces of  wine, or 1.5 ounces of 80-proof liquor.  Limiting alcohol to less than one drink a Giuffre for women or two drinks a Munguia for men can help lower blood pressure by about 4 mm Hg.   If you have any questions or concerns please use My Chart to send questions or call the office at [phone]

## 2023-09-24 NOTE — Progress Notes (Signed)
 Patient ID: Corey Hood                 DOB: 1945-12-13                      MRN: [id number]      HPI: Corey Hood is a 78 y.o. male referred by Dr. Mayford Knife to HTN clinic. PMH is significant for persistent AF s/p TEE/DCCV 04/2015, HTN and moderate OSA with AHI 21.3/hr with nocturnal hypoxemia and oxygen desaturations as low as 70%.   Patient presented today for BP follow up. Reports home BP ~132/70. Feels great on new medications. He goes to [place] and they put him on spironolactone which he takes it for many years. He still use salt. 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 Want ed to loose weight ut unsuccessful with lifestyle intervention. Dicussed Zepbound and we applied for PA and it is approved but the co-pay is high so patient want to think about it and will let us know next OV.    Current HTN meds: irbesartan 300 mg daily and diltiazem 240 mg daily, spironolactone 25 mg daily   Previously tried:  BP goal: <130/80  Family History:  Relation Problem Comments  Mother Metallurgist) Cancer     Father (Deceased) Cancer      Social History:   Alcohol: 2 beers per day  Smoking: never   Diet: states eats low salt diet but reports he still uses salt shaker   Exercise: 3 time per week  60 min aqua aerobics    Home BP readings:  133 76  132 63  132 76  135 70  135 70  126 64  129 63  139 78  132.625 70    Wt Readings from Last 3 Encounters:  08/11/23 221 lb 3.2 oz (100.3 kg)  08/08/22 215 lb (97.5 kg)  06/18/22 210 lb (95.3 kg)   BP Readings from Last 3 Encounters:  09/24/23 (!) 128/57  08/11/23 (!) 162/74  08/08/22 (!) 158/72   Pulse Readings from Last 3 Encounters:  09/24/23 85  08/11/23 86  08/08/22 72    Renal function: CrCl cannot be calculated (Unknown ideal weight.).  Past Medical History:  Diagnosis Date   Aortic atherosclerosis (HCC)    Arthritis of both knees    BPH (benign prostatic hyperplasia)    Hypertension    Obesity  (BMI 30-39.9) 04/08/2016   OSA (obstructive sleep apnea) 03/30/2017   Moderate with AHI 21.3/hr with nocturnal hypoxemia by sleep study   Persistent atrial fibrillation (HCC)    s/p TEE/DCCV.  He is on Xarelto for a CHADS2VASC score 2    Current Outpatient Medications on File Prior to Visit  Medication Sig Dispense Refill   Coenzyme Q10 (COQ10) 100 MG CAPS Take 100 mg by mouth 2 (two) times daily.      diltiazem (TIAZAC) 240 MG 24 hr capsule Take 240 mg by mouth daily.     flecainide (TAMBOCOR) 100 MG tablet Take 1 tablet (100 mg total) by mouth every 12 (twelve) hours. 60 tablet 11   irbesartan (AVAPRO) 300 MG tablet Take 1 tablet (300 mg total) by mouth daily. 90 tablet 3   rivaroxaban (XARELTO) 20 MG TABS tablet Take 1 tablet (20 mg total) by mouth daily with supper. 30 tablet 6   spironolactone (ALDACTONE) 25 MG tablet Take 25 mg by mouth daily.     Tragacanth (ASTRAGALUS  ROOT) POWD Take 1 capsule by mouth 2 (two) times daily.      zinc gluconate 50 MG tablet Take 50 mg by mouth daily.     HYDROcodone-acetaminophen (NORCO/VICODIN) 5-325 MG tablet Take 1 tablet by mouth every 6 (six) hours as needed. (Patient not taking: Reported on 08/11/2023)     Krill Oil 500 MG CAPS Take 500 mg by mouth daily.      Magnesium 400 MG TABS Take 400 mg by mouth daily.      Multiple Vitamin (MULTIVITAMIN WITH MINERALS) TABS tablet Take 2 tablets by mouth daily.      OVER THE COUNTER MEDICATION Take 1 capsule by mouth 2 (two) times daily. Livaplex Supplement     potassium chloride SA (KLOR-CON M15) 15 MEQ tablet Take 15 mEq by mouth 2 (two) times daily.     triamcinolone cream (KENALOG) 0.1 % Apply 1 application topically daily as needed.  (Patient not taking: Reported on 08/11/2023)     No current facility-administered medications on file prior to visit.    Allergies  Allergen Reactions   Penicillins Hives and Other (See Comments)    Has patient had a PCN reaction causing immediate rash,  facial/tongue/throat swelling, SOB or lightheadedness with hypotension: Unknown Has patient had a PCN reaction causing severe rash involving mucus membranes or skin necrosis: No Has patient had a PCN reaction that required hospitalization: No Has patient had a PCN reaction occurring within the last 10 years: No If all of the above answers are "NO", then may proceed with Cephalosporin use.  Has patient had a PCN reaction causing immediate rash, facial/tongue/throat swelling, SOB or lightheadedness with hypotension: Unknown Has patient had a PCN reaction causing severe rash involving mucus membranes or skin necrosis: No Has patient had a PCN reaction that required hospitalization: No Has patient had a PCN reaction occurring within the last 10 years: No If all of the above answers are "NO", then may proceed with Cephalosporin use.   Benazepril Cough and Other (See Comments)   Codeine Other (See Comments)    "makes me loopy" "makes me loopy" "makes me loopy" "makes me loopy" "makes me loopy" Other reaction(s): Other (See Comments) "makes me loopy"   Prednisone Other (See Comments)    States "not taking anymore" States "not taking anymore" States "not taking anymore"    Blood pressure (!) 128/57, pulse 85, SpO2 97%.   Assessment/Plan:  1. Hypertension -  Hypertension Assessment: BP is controlled in office BP 128/57 mmHg heart rate 85 (goal <130/80) Home BP ~132/70 heart rate 75  Takes current BP medications regularly and tolerates them well without any side effects Denies SOB, palpitation, chest pain, headaches,or swelling Does water aerobics 3 times per week  Reiterated the importance of and low salt diet  Received Zepbound PA approval for OSA+ obesity, co-pay is >$500 pt will think about it   Plan:  Continue taking irbesartan 300 mg daily and diltiazem 240 mg daily, spironolactone 25 mg daily Patient to keep record of BP readings with heart rate and report to [place] at the next  visit Patient to see PharmD in 8 weeks for follow up  Follow up lab(s):none       Thank you  Carmela Hurt, Pharm.D [facility] A Division of [facility] [address], [place], [place] [address]  Phone: [phone]; Fax: [phone]

## 2023-09-24 NOTE — Telephone Encounter (Signed)
 Pharmacy Patient Advocate Encounter   Received notification from Pt Calls Messages that prior authorization for zepbound is required/requested.   Insurance verification completed.   The patient is insured through [employer] .   Per test claim: PA required; PA submitted to above mentioned insurance via CoverMyMeds Key/confirmation #/EOC [id number] Status is pending

## 2023-09-24 NOTE — Assessment & Plan Note (Signed)
 Assessment: BP is controlled in office BP 128/57 mmHg heart rate 85 (goal <130/80) Home BP ~132/70 heart rate 75  Takes current BP medications regularly and tolerates them well without any side effects Denies SOB, palpitation, chest pain, headaches,or swelling Does water aerobics 3 times per week  Reiterated the importance of and low salt diet  Received Zepbound PA approval for OSA+ obesity, co-pay is >$500 pt will think about it   Plan:  Continue taking irbesartan 300 mg daily and diltiazem 240 mg daily, spironolactone 25 mg daily Patient to keep record of BP readings with heart rate and report to [place] at the next visit Patient to see PharmD in 8 weeks for follow up  Follow up lab(s):none

## 2023-09-24 NOTE — Telephone Encounter (Signed)
 Pharmacy Patient Advocate Encounter  Received notification from [facility] that Prior Authorization for ZEPBOUND has been APPROVED from 07/15/23 to 07/13/24. Ran test claim, Copay is $572.01. This test claim was processed through [facility]- copay amounts may vary at other pharmacies due to pharmacy/plan contracts, or as the patient moves through the different stages of their insurance plan.   PA #/Case ID/Reference #:  [id number]

## 2023-10-02 NOTE — Telephone Encounter (Signed)
 PA approved but patient can not afford the co-pay

## 2023-10-02 NOTE — Telephone Encounter (Signed)
 PA approved but pt can not afford the co-pay, see other encounter

## 2023-11-26 ENCOUNTER — Ambulatory Visit: Admitting: Pharmacist

## 2024-08-10 NOTE — Progress Notes (Unsigned)
 " Cardiology Office Note   Date:  08/11/2024  ID:  Jermarion, Poffenberger September 07, 1945, MRN [id number] PCP: Teddie Maiers, MD   HeartCare Providers Cardiologist:  Wilbert Bihari, MD   History of Present Illness Corey Hood is a 79 y.o. male with a hx of persistent AF s/p TEE/DCCV 04/2015, HTN and moderate OSA with AHI 21.3/hr with nocturnal hypoxemia and oxygen desaturations as low as 70%.  He did not tolerate CPAP due to ongoing respiratory events and underwent BiPAP titration to 16/12cm H2O.     He was seen for follow-up a year ago and was doing well at that time.SABRA  He denies any chest pain or pressure,  PND, orthopnea, LE edema, dizziness, palpitations or syncope.  He has chronic DOE that he attributes to being overweight.  It mainly occurs when walking up a hill and not with exercise in the pool.  He is compliant with his meds and is tolerating meds with no SE.    From a PAP device standpoint he was doing well and thought he got used to it.  Tolerates full mask but it bothers him over the bridge of his nose.  Feels the pressure is adequate.  No significant daytime sleepiness and feels he is rested in the morning.  He does have problems with the mouth and nose dryness but no significant nasal congestion.  Does not think that he snores.  Today, he has a hx of atrial fibrillation and lymphedema following prostate radiation and right hip surgery who presents for cardiovascular follow-up.  He uses his BPAP machine nightly for about ten hours despite discomfort and denies snoring. He was last seen about a year ago and is considering changing to a closer cardiology clinic because of concern about long-distance driving safety.  He developed knee pain last May that led to imaging and discovery of blood clots while on Xarelto . He was treated with Lovenox injections twice daily for a month with resolution of the clots and was referred to hematology. He was later diagnosed with lymphedema.  He has had  fluid retention treated with chlorthalidone and furosemide, with weight reduction from 217 to 203 pounds and improvement in symptoms. He notes marked diuresis with these medications. His blood pressure improved from the 150s to the 120s after volume reduction.  He reports daily epistaxis, especially with nose blowing, with small blood clots. This was less severe on Xarelto  after an initial period of significant nosebleeds. He is now on a different anticoagulant per hematology.  He has atrial fibrillation with prior cardioversions but no recent symptomatic episodes. He takes diltiazem , which lowers his heart rate to the 50s, and reports no recent atrial fibrillation issues.  He notes a prior hip operation that he believes contributed to his lymphedema. He stopped drinking alcohol in December and feels his health is better. He is concerned about elevated glucose but reports his hemoglobin A1c improved to 6.1.  Reports no shortness of breath nor dyspnea on exertion. Reports no chest pain, pressure, or tightness. No edema, orthopnea, PND. Reports no palpitations.   Discussed the use of AI scribe software for clinical note transcription with the patient, who gave verbal consent to proceed.  ROS: Pertinent ROS in HPI  Studies Reviewed EKG Interpretation Date/Time:  Thursday August 11 2024 13:07:48 EST Ventricular Rate:  64 PR Interval:  220 QRS Duration:  106 QT Interval:  408 QTC Calculation: 420 R Axis:   26  Text Interpretation: Sinus rhythm with 1st degree A-V  block Incomplete right bundle branch block When compared with ECG of 11-Aug-2023 15:18, No significant change was found Confirmed by Lucien Blanc [phone]) on 08/11/2024 1:39:00 PM   Echo TEE 02/2018 Study Conclusions   - Left ventricle: The cavity size was normal. There was mild    concentric hypertrophy. Systolic function was normal. The    estimated ejection fraction was in the range of 60% to 65%. Wall    motion was normal; there  were no regional wall motion    abnormalities.  - Aortic valve: No evidence of vegetation.  - Aorta: Mild atheromatous disease.  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    Mild, anteriorly directed regurgitation.  - Left atrium: Moderately dilated. No evidence of thrombus in the    atrial cavity or appendage.  - Right atrium: The atrium was dilated.  - Atrial septum: No defect or patent foramen ovale was identified.  - Pulmonic valve: No evidence of vegetation.   Impressions:   - No cardiac source of emboli was indentified. Successful DCCV to    NSR.       Physical Exam VS:  BP 114/60   Pulse 64   Ht 5' 8 (1.727 m)   Wt 211 lb (95.7 kg)   SpO2 99%   BMI 32.08 kg/m        Wt Readings from Last 3 Encounters:  08/11/24 211 lb (95.7 kg)  08/11/23 221 lb 3.2 oz (100.3 kg)  08/08/22 215 lb (97.5 kg)    GEN: Well nourished, well developed in no acute distress NECK: No JVD; No carotid bruits CARDIAC: RRR, no murmurs, rubs, gallops RESPIRATORY:  Clear to auscultation without rales, wheezing or rhonchi  ABDOMEN: Soft, non-tender, non-distended EXTREMITIES:  right mild lower leg edema; No deformity   ASSESSMENT AND PLAN  Paroxysmal atrial fibrillation No recent episodes to patient's knowledge. Cardioversions successful. Bradycardia due to diltiazem  acceptable. No EKG abnormalities. - Continue diltiazem . - Monitor heart rate, report if <50 bpm.  Essential hypertension Blood pressure well-controlled with current medications. Diuretic therapy improved control. - Continue current antihypertensive regimen.  Chronic lower extremity lymphedema Secondary to radiation therapy and hip surgery. Managed with compression garments and diuretics. Fluid retention improved. - Continue compression garments. 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 Current anticoagulation  effective. Concerns about epistaxis. Previous Xarelto  issues. - Continue current anticoagulation therapy. - Consider ENT referral for epistaxis management.  Obstructive sleep apnea Well-managed with CPAP. Excellent compliance, 10 hours/night. - Continue CPAP therapy.  Hyperlipidemia LDL slightly elevated at 105 mg/dL. Goal <70 mg/dL due to cardiovascular risk. Lifestyle changes may improve profile. - Not currently on lipid lowering therapy - Encouraged continued lifestyle modifications, including alcohol cessation. -[facility] who [facility] through [facility]. Would recommend them to address to avoid medication confusion since this has been an issue in the past.    Dispo: He can follow-up in [place] with Dr. Pietro as long as Dr. Shlomo agrees  Signed, Blanc LOISE Lucien, PA-C   "

## 2024-08-11 ENCOUNTER — Encounter: Payer: Self-pay | Admitting: Physician Assistant

## 2024-08-11 ENCOUNTER — Ambulatory Visit: Admitting: Physician Assistant

## 2024-08-11 VITALS — BP 114/60 | HR 64 | Ht 68.0 in | Wt 211.0 lb

## 2024-08-11 DIAGNOSIS — I10 Essential (primary) hypertension: Secondary | ICD-10-CM | POA: Diagnosis not present

## 2024-08-11 DIAGNOSIS — Z79899 Other long term (current) drug therapy: Secondary | ICD-10-CM | POA: Diagnosis not present

## 2024-08-11 DIAGNOSIS — I480 Paroxysmal atrial fibrillation: Secondary | ICD-10-CM

## 2024-08-11 DIAGNOSIS — G4733 Obstructive sleep apnea (adult) (pediatric): Secondary | ICD-10-CM | POA: Diagnosis not present

## 2024-08-11 MED ORDER — IRBESARTAN 300 MG PO TABS: 300.0000 mg | ORAL_TABLET | Freq: Every day | ORAL | 3 refills | Status: AC

## 2024-08-11 NOTE — Patient Instructions (Signed)
 Medication Instructions:  Please let your provider know about the referral from ENT *If you need a refill on your cardiac medications before your next appointment, please call your pharmacy*  Lab Work: No labs were ordered during today's visit.  If you have labs (blood work) drawn today and your tests are completely normal, you will receive your results only by: MyChart Message (if you have MyChart) OR A paper copy in the mail If you have any lab test that is abnormal or we need to change your treatment, we will call you to review the results.  Testing/Procedures: No procedures were ordered during today's visit.   Follow-Up: At [facility], you and your health needs are our priority.  As part of our continuing mission to provide you with exceptional heart care, our providers are all part of one team.  This team includes your primary Cardiologist (physician) and Advanced Practice Providers or APPs (Physician Assistants and Nurse Practitioners) who all work together to provide you with the care you need, when you need it.  Your next appointment:   1 year(s)  Provider:   Dr. Pietro   We recommend signing up for the patient portal called MyChart.  Sign up information is provided on this After Visit Summary.  MyChart is used to connect with patients for Virtual Visits (Telemedicine).  Patients are able to view lab/test results, encounter notes, upcoming appointments, etc.  Non-urgent messages can be sent to your provider as well.   To learn more about what you can do with MyChart, go to [email or web address].   Other Instructions
